# Patient Record
Sex: Female | Born: 1980 | Race: White | Hispanic: No | Marital: Married | Smoking: Former smoker
Health system: Southern US, Community
[De-identification: ages and names within clinical notes are randomized; demographics above are authoritative.]

## PROBLEM LIST (undated history)

## (undated) DIAGNOSIS — R87619 Unspecified abnormal cytological findings in specimens from cervix uteri: Secondary | ICD-10-CM

## (undated) DIAGNOSIS — O2200 Varicose veins of lower extremity in pregnancy, unspecified trimester: Secondary | ICD-10-CM

## (undated) DIAGNOSIS — IMO0002 Reserved for concepts with insufficient information to code with codable children: Secondary | ICD-10-CM

## (undated) DIAGNOSIS — R87629 Unspecified abnormal cytological findings in specimens from vagina: Secondary | ICD-10-CM

## (undated) HISTORY — DX: Reserved for concepts with insufficient information to code with codable children: IMO0002

## (undated) HISTORY — DX: Unspecified abnormal cytological findings in specimens from vagina: R87.629

## (undated) HISTORY — DX: Unspecified abnormal cytological findings in specimens from cervix uteri: R87.619

## (undated) HISTORY — DX: Varicose veins of lower extremity in pregnancy, unspecified trimester: O22.00

## (undated) HISTORY — PX: TONSILLECTOMY: SUR1361

---

## 2008-02-07 ENCOUNTER — Ambulatory Visit: Payer: Self-pay | Admitting: Obstetrics & Gynecology

## 2008-02-13 ENCOUNTER — Ambulatory Visit: Payer: Self-pay | Admitting: Obstetrics and Gynecology

## 2008-02-17 ENCOUNTER — Encounter: Admission: RE | Admit: 2008-02-17 | Discharge: 2008-02-17 | Payer: Self-pay | Admitting: Obstetrics & Gynecology

## 2008-02-22 ENCOUNTER — Ambulatory Visit: Payer: Self-pay | Admitting: Obstetrics & Gynecology

## 2008-02-27 ENCOUNTER — Ambulatory Visit: Payer: Self-pay | Admitting: Family

## 2008-02-27 ENCOUNTER — Encounter: Payer: Self-pay | Admitting: Family

## 2008-03-30 ENCOUNTER — Ambulatory Visit: Payer: Self-pay | Admitting: Physician Assistant

## 2008-04-02 ENCOUNTER — Ambulatory Visit: Payer: Self-pay | Admitting: Obstetrics & Gynecology

## 2008-04-20 ENCOUNTER — Ambulatory Visit: Payer: Self-pay | Admitting: Physician Assistant

## 2008-05-11 ENCOUNTER — Ambulatory Visit (HOSPITAL_COMMUNITY): Admission: RE | Admit: 2008-05-11 | Discharge: 2008-05-11 | Payer: Self-pay | Admitting: Obstetrics & Gynecology

## 2008-05-18 ENCOUNTER — Ambulatory Visit: Payer: Self-pay | Admitting: Family

## 2008-06-22 ENCOUNTER — Ambulatory Visit: Payer: Self-pay | Admitting: Obstetrics and Gynecology

## 2008-06-22 LAB — CONVERTED CEMR LAB
HCT: 37.6 % (ref 36.0–46.0)
MCHC: 33.2 g/dL (ref 30.0–36.0)
MCV: 90.4 fL (ref 78.0–100.0)
Platelets: 221 10*3/uL (ref 150–400)
RDW: 13.3 % (ref 11.5–15.5)
WBC: 8.7 10*3/uL (ref 4.0–10.5)

## 2008-07-20 ENCOUNTER — Ambulatory Visit: Payer: Self-pay | Admitting: Family

## 2008-07-20 LAB — CONVERTED CEMR LAB
ALT: 14 units/L (ref 0–35)
AST: 15 units/L (ref 0–37)
Albumin: 3.2 g/dL — ABNORMAL LOW (ref 3.5–5.2)
Alkaline Phosphatase: 55 units/L (ref 39–117)
Antibody Screen: NEGATIVE
Calcium: 8.2 mg/dL — ABNORMAL LOW (ref 8.4–10.5)
Chloride: 106 meq/L (ref 96–112)
MCHC: 32.9 g/dL (ref 30.0–36.0)
Platelets: 218 10*3/uL (ref 150–400)
Potassium: 3.9 meq/L (ref 3.5–5.3)
RDW: 13.4 % (ref 11.5–15.5)
Sodium: 140 meq/L (ref 135–145)

## 2008-07-23 ENCOUNTER — Ambulatory Visit: Payer: Self-pay | Admitting: Obstetrics & Gynecology

## 2008-07-23 ENCOUNTER — Encounter: Payer: Self-pay | Admitting: Family

## 2008-07-23 LAB — CONVERTED CEMR LAB
Creatinine 24 HR UR: 1442 mg/24hr (ref 700–1800)
Creatinine Clearance: 193 mL/min — ABNORMAL HIGH (ref 75–115)
Creatinine, Urine: 62.7 mg/dL

## 2008-08-03 ENCOUNTER — Ambulatory Visit: Payer: Self-pay | Admitting: Family

## 2008-08-09 ENCOUNTER — Ambulatory Visit: Payer: Self-pay | Admitting: Obstetrics & Gynecology

## 2008-08-17 ENCOUNTER — Ambulatory Visit: Payer: Self-pay | Admitting: Family

## 2008-08-29 ENCOUNTER — Ambulatory Visit: Payer: Self-pay | Admitting: Obstetrics & Gynecology

## 2008-09-12 ENCOUNTER — Ambulatory Visit: Payer: Self-pay | Admitting: Obstetrics & Gynecology

## 2008-09-12 LAB — CONVERTED CEMR LAB
Chlamydia, DNA Probe: NEGATIVE
GC Probe Amp, Genital: NEGATIVE

## 2008-09-19 ENCOUNTER — Ambulatory Visit: Payer: Self-pay | Admitting: Obstetrics & Gynecology

## 2008-09-26 ENCOUNTER — Ambulatory Visit: Payer: Self-pay | Admitting: Obstetrics & Gynecology

## 2008-09-27 ENCOUNTER — Ambulatory Visit (HOSPITAL_COMMUNITY): Admission: RE | Admit: 2008-09-27 | Discharge: 2008-09-27 | Payer: Self-pay | Admitting: Obstetrics & Gynecology

## 2008-10-01 ENCOUNTER — Inpatient Hospital Stay (HOSPITAL_COMMUNITY): Admission: AD | Admit: 2008-10-01 | Discharge: 2008-10-01 | Payer: Self-pay | Admitting: Family Medicine

## 2008-10-01 ENCOUNTER — Ambulatory Visit: Payer: Self-pay | Admitting: Family

## 2008-10-04 ENCOUNTER — Ambulatory Visit: Payer: Self-pay | Admitting: Family

## 2008-10-04 ENCOUNTER — Inpatient Hospital Stay (HOSPITAL_COMMUNITY): Admission: AD | Admit: 2008-10-04 | Discharge: 2008-10-05 | Payer: Self-pay | Admitting: Obstetrics and Gynecology

## 2008-11-16 ENCOUNTER — Ambulatory Visit: Payer: Self-pay | Admitting: Family

## 2009-02-12 ENCOUNTER — Ambulatory Visit: Payer: Self-pay | Admitting: Obstetrics & Gynecology

## 2009-05-08 IMAGING — US US OB FOLLOW-UP
1 series · 14 of 16 positions shown · non-contrast
Comparison: none

OBSTETRICAL ULTRASOUND:
 This ultrasound exam was performed in the [HOSPITAL] Ultrasound Department.  The OB US report was generated in the AS system, and faxed to the ordering physician.  This report is also available in [REDACTED] PACS.

[Series 1: us ob follow up · 14 of 16 slices shown]
[im 1/16]
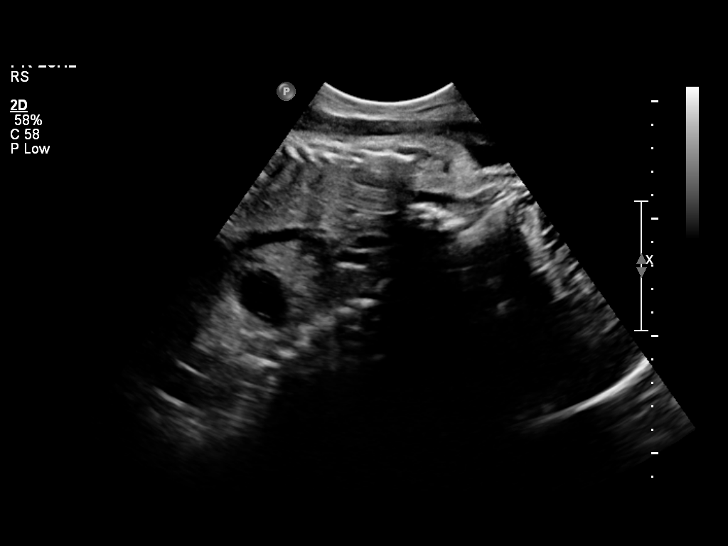
[im 2/16]
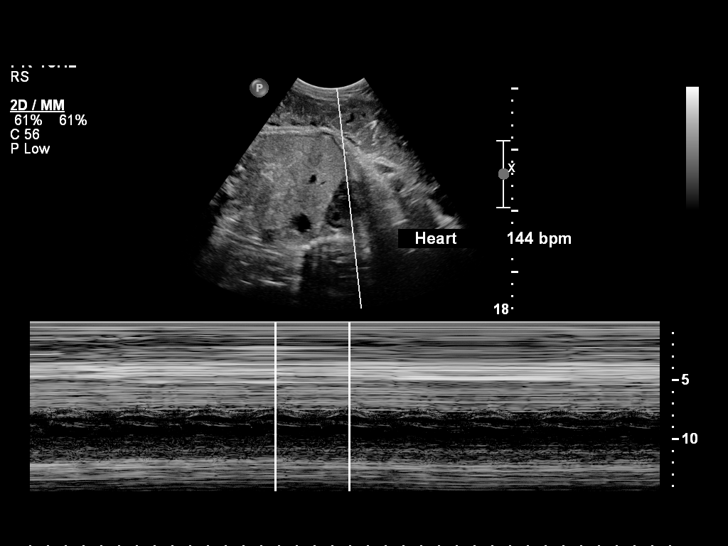
[im 3/16]
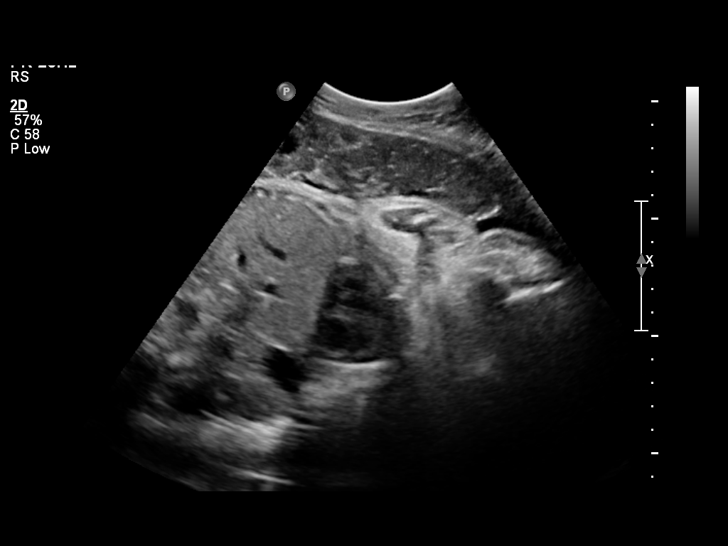
[im 5/16]
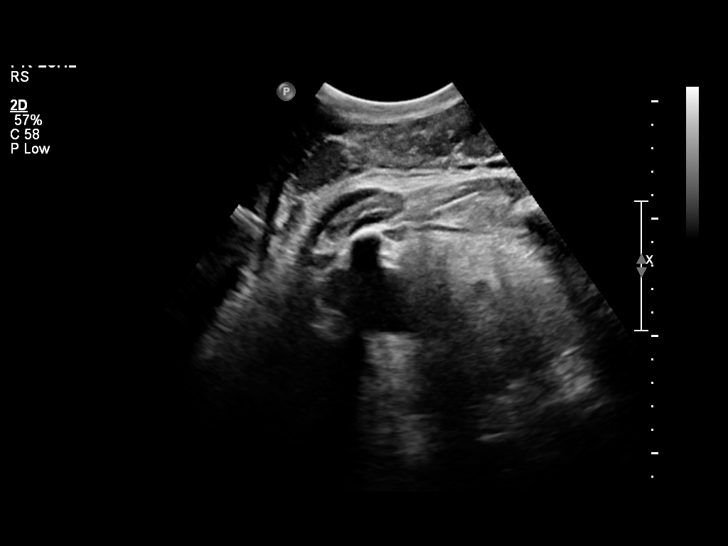
[im 6/16]
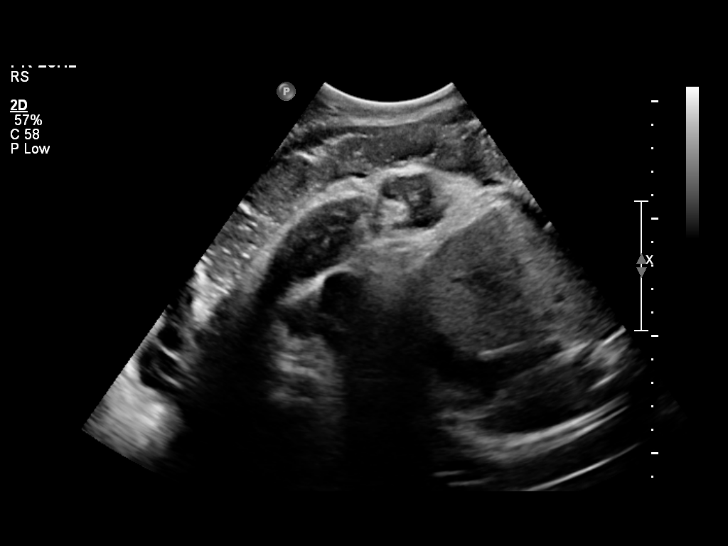
[im 7/16]
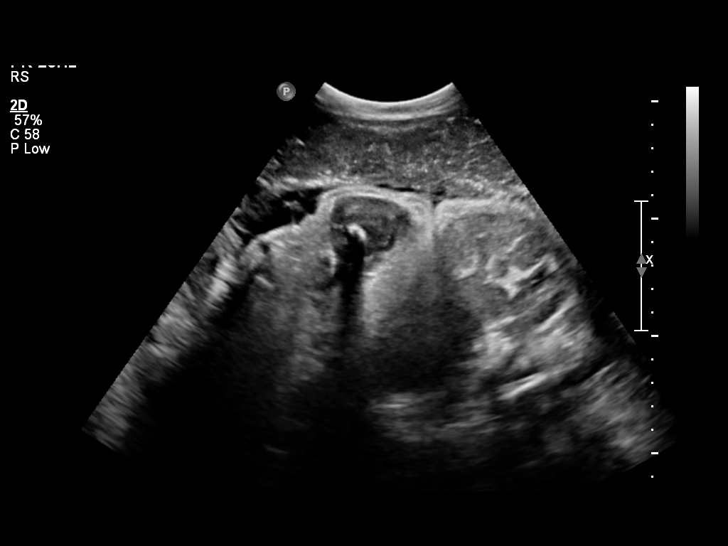
[im 8/16]
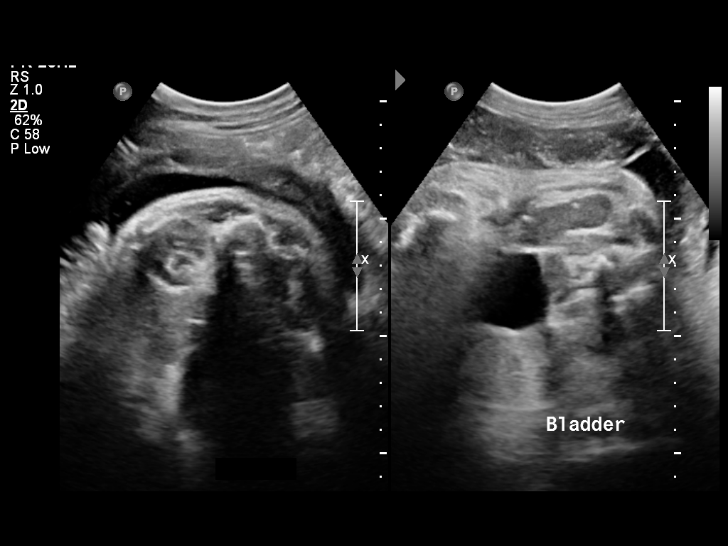
[im 9/16]
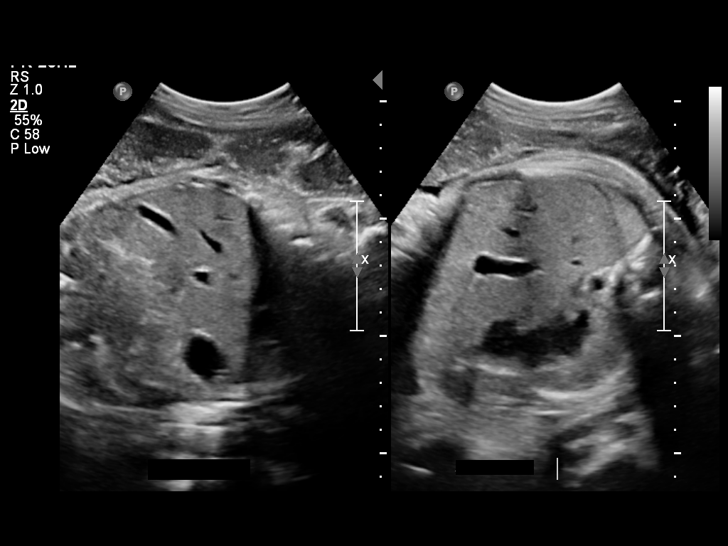
[im 10/16]
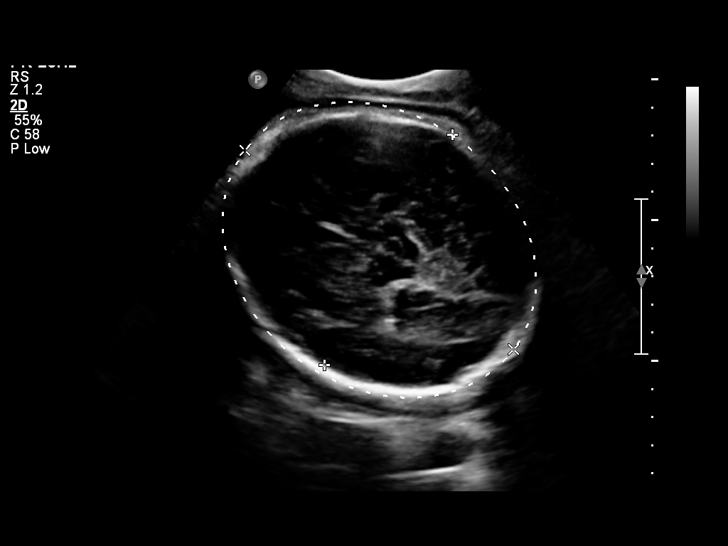
[im 11/16]
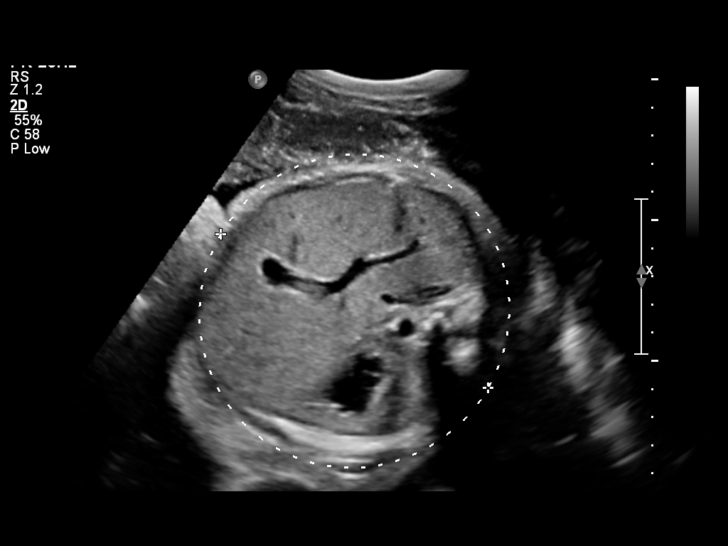
[im 13/16]
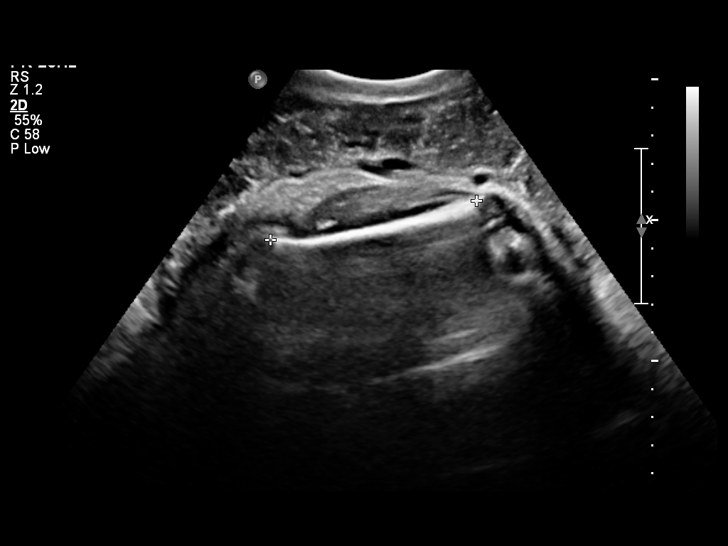
[im 14/16]
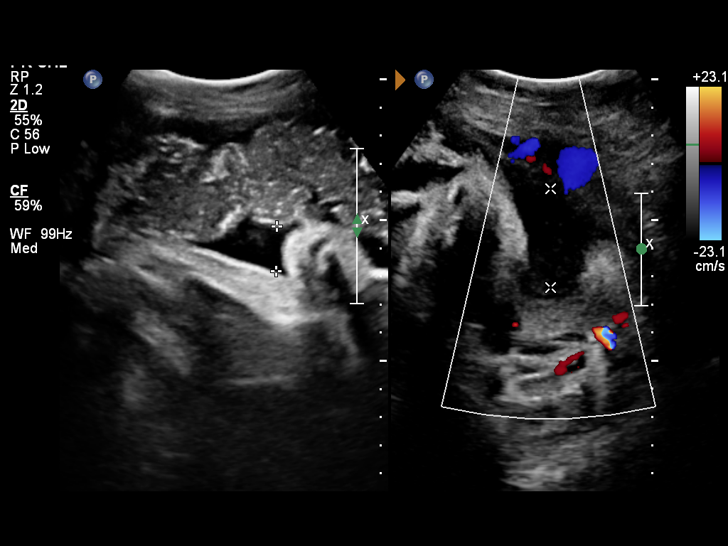
[im 15/16]
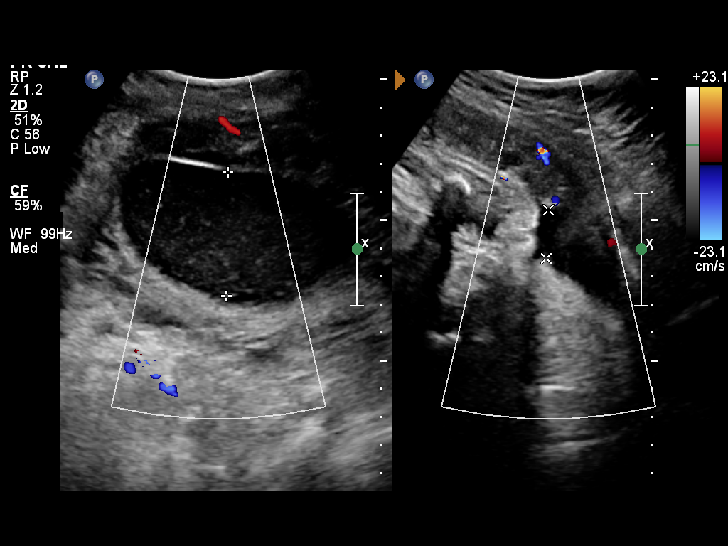
[im 16/16]
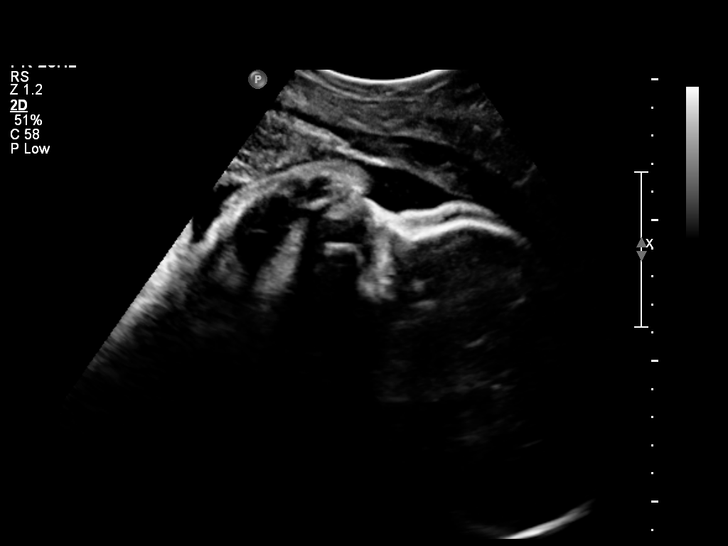

[14 of 16 positions shown; findings below may reference images not displayed]

IMPRESSION: See AS Obstetric US report.

## 2009-05-10 ENCOUNTER — Ambulatory Visit: Payer: Self-pay | Admitting: Family

## 2009-07-24 ENCOUNTER — Ambulatory Visit: Payer: Self-pay | Admitting: Family Medicine

## 2009-07-24 DIAGNOSIS — G4700 Insomnia, unspecified: Secondary | ICD-10-CM

## 2009-07-24 DIAGNOSIS — B009 Herpesviral infection, unspecified: Secondary | ICD-10-CM | POA: Insufficient documentation

## 2009-07-30 ENCOUNTER — Telehealth: Payer: Self-pay | Admitting: Family Medicine

## 2009-08-02 ENCOUNTER — Ambulatory Visit: Payer: Self-pay | Admitting: Obstetrics & Gynecology

## 2009-08-15 ENCOUNTER — Encounter: Payer: Self-pay | Admitting: Obstetrics & Gynecology

## 2009-10-24 ENCOUNTER — Ambulatory Visit: Payer: Self-pay | Admitting: Obstetrics & Gynecology

## 2010-01-03 ENCOUNTER — Ambulatory Visit: Payer: Self-pay | Admitting: Obstetrics & Gynecology

## 2010-05-01 ENCOUNTER — Encounter: Payer: Self-pay | Admitting: Family Medicine

## 2010-05-01 LAB — CONVERTED CEMR LAB: Pap Smear: ABNORMAL

## 2010-05-16 ENCOUNTER — Ambulatory Visit: Payer: Self-pay | Admitting: Family

## 2010-06-11 ENCOUNTER — Ambulatory Visit
Admission: RE | Admit: 2010-06-11 | Discharge: 2010-06-11 | Payer: Self-pay | Source: Home / Self Care | Attending: Obstetrics & Gynecology | Admitting: Obstetrics & Gynecology

## 2010-06-22 ENCOUNTER — Encounter: Payer: Self-pay | Admitting: Obstetrics & Gynecology

## 2010-07-01 NOTE — Assessment & Plan Note (Signed)
Summary: NOV:CPE   Vital Signs:  Patient profile:   30 year old female Height:      67 inches Weight:      146 pounds BMI:     22.95 Pulse rate:   82 / minute BP sitting:   119 / 75  (left arm) Cuff size:   regular  Vitals Entered By: Kathlene November (July 24, 2009 9:34 AM) CC: NP- get established Is Patient Diabetic? No  Vision Screening:Left eye with correction: 20 / 20 Right eye with correction: 20 / 25 Both eyes with correction: 20 / 15  Color vision testing: normal      Vision Entered By: Kathlene November (July 24, 2009 10:10 AM)   Primary Care Provider:  Nani Gasser MD  CC:  NP- get established.  History of Present Illness: Having some insomnia issues after the birth of her child. He is now 9 months and just starting to sleep through the night. No caffeine in the afternoon. Tries to wind down in the evening. The ambien really helps. Just started it recently.    But doesn't want to become dependent.    Feels has had some vision changes since having her son. Also recent hair loss and frequent flushing.  She is still breast feeding.  More frequent HA.  No anemia with her pregnancy. No hx of thyroid problems.   Habits & Providers  Alcohol-Tobacco-Diet     Alcohol drinks/day: <1     Tobacco Status: quit     Cigarette Packs/Day: 0.5     Year Quit: 2006  Exercise-Depression-Behavior     Does Patient Exercise: no     STD Risk: never     Drug Use: no     Seat Belt Use: always  Current Medications (verified): 1)  Ambien 10 Mg Tabs (Zolpidem Tartrate) .... Take One Tablet By Mouth At Bedtime As Needed 2)  Zovirax 400 Mg Tabs (Acyclovir) .... Take One Tablet By Mouth Tiwc A Day 3)  Prenatal Vitamins 0.8 Mg Tabs (Prenatal Multivit-Min-Fe-Fa) .... Take One Tablet By Mouth Twice A Day 4)  Ibuprofen 200 Mg Tabs (Ibuprofen) .... Take One Tablet By Mouth Twice A Day 5)  Depo-Provera 150 Mg/ml Susp (Medroxyprogesterone Acetate) .... Inject Every 3 Months  Allergies  (verified): No Known Drug Allergies  Comments:  Nurse/Medical Assistant: The patient's medications and allergies were reviewed with the patient and were updated in the Medication and Allergy Lists. 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  Married to [employer] with 2 kids.   Former Smoker Alcohol use-yes, 2-3 per week.  Drug use-no Regular exercise-no 20 oz soda a day.  Smoking Status:  quit Packs/Day:  0.5 Does Patient Exercise:  no STD Risk:  never Drug Use:  no Seat Belt Use:  always  Review of Systems       No fever/sweats/weakness, unexplained weight loss/gain.  + vison changes.  No difficulty hearing/ringing in ears, hay fever/allergies.  No chest pain/discomfort, palpitations.  No Br lump/nipple discharge.  No cough/wheeze.  No blood in BM, nausea/vomiting/diarrhea.  No nighttime urination, leaking urine, unusual vaginal bleeding, discharge (penis or vagina).  No muscle/joint pain. No rash, change in mole.  + HA, no memory loss.  No anxiety, + sleep d/o,  no depression.  No easy bruising/bleeding, unexplained lump   Physical Exam  General:  Well-developed,well-nourished,in no acute distress; alert,appropriate and cooperative throughout examination Head:  Normocephalic and atraumatic without obvious abnormalities. No apparent alopecia or balding. Eyes:  No corneal or conjunctival inflammation noted. EOMI. Perrla.  Ears:  External ear exam shows no significant lesions or deformities.  Otoscopic examination reveals clear canals, tympanic membranes are intact bilaterally without bulging, retraction, inflammation or discharge. Hearing is grossly normal bilaterally. Nose:  External nasal examination shows no deformity or inflammation. Nasal  mucosa are pink and moist without lesions or exudates. Mouth:  Oral mucosa and oropharynx without lesions or exudates.  Teeth in good repair. Neck:  No deformities, masses, or tenderness noted. Chest Wall:  No deformities, masses, or tenderness noted. Lungs:  Normal respiratory effort, chest expands symmetrically. Lungs are clear to auscultation, no crackles or wheezes. Heart:  Normal rate and regular rhythm. S1 and S2 normal without gallop, murmur, click, rub or other extra sounds. Abdomen:  Bowel sounds positive,abdomen soft and non-tender without masses, organomegaly or hernias noted. Msk:  No deformity or scoliosis noted of thoracic or lumbar spine.   Pulses:  R and L carotid,radial,dorsalis pedis and posterior tibial pulses are full and equal bilaterally Extremities:  No clubbing, cyanosis, edema, or deformity noted with normal full range of motion of all joints.   Neurologic:  No cranial nerve deficits noted. Station and gait are normal. Plantar reflexes are down-going bilaterally. DTRs are symmetrical throughout. Sensory, motor and coordinative functions appear intact. Skin:  no rashes.   Cervical Nodes:  No lymphadenopathy noted Psych:  Cognition and judgment appear intact. Alert and cooperative with normal attention span and concentration. No apparent delusions, illusions, hallucinations   Impression & Recommendations:  Problem # 1:  HEALTH MAINTENANCE EXAM (ICD-V70.0) Encouage daily calcium. Will check labs and rule out thyroid d/o.   Encourage daily exercise.  Due for screening labs.   Orders: T-Comprehensive Metabolic Panel [id number]) T-Lipid Profile [phone]) T-TSH [id number])  Problem # 2:  INSOMNIA (ICD-780.52) Lets try to switch to med that doesn't cause dependency.  Trazodone is safe with Br feeding so will try this first. Fu in 1-2 months. Call if any concerns.  Her updated medication list for this problem includes:    Ambien 10 Mg Tabs (Zolpidem tartrate)  .Marland Kitchen... Take one tablet by mouth at bedtime as needed  Complete Medication List: 1)  Ambien 10 Mg Tabs (Zolpidem tartrate) .... Take one tablet by mouth at bedtime as needed 2)  Zovirax 400 Mg Tabs (Acyclovir) .... Take one tablet by mouth tiwc a day 3)  Prenatal Vitamins 0.8 Mg Tabs (Prenatal multivit-min-fe-fa) .... Take one tablet by mouth twice a day 4)  Ibuprofen 200 Mg Tabs (Ibuprofen) .... Take one tablet by mouth twice a day 5)  Depo-provera 150 Mg/ml Susp (Medroxyprogesterone acetate) .... Inject every 3 months 6)  Trazodone Hcl 50 Mg Tabs (Trazodone hcl) .... 1/2 to 1 tabs about  1 hour before bedtime.  Patient Instructions: 1)  Encourage 1000mg  of calcium daily or 4 servings of dairy a day. 2)  [facility] [phone] Prescriptions: TRAZODONE HCL 50 MG TABS (TRAZODONE HCL) 1/2 to 1 tabs about  1 hour before bedtime.  #30 x 1   Entered and Authorized by:   Nani Gasser MD   Signed by:   Nani Gasser MD on 07/24/2009   Method used:   Electronically to        [facility]  [address] [phone]* (retail)       [address]       [place], [place]  [address]       Ph: [phone] or [phone]       Fax: [phone]   RxID:   [id number]   PAP Result Date:  01/30/2009 PAP Result:  normal

## 2010-07-01 NOTE — Progress Notes (Signed)
Summary: Lab Results  Phone Note Call from Patient Call back at Home Phone [phone]   Caller: Patient Reason for Call: Lab or Test Results Summary of Call: Patient called and left voice message requesting lab results form last office visit  Initial call taken by: Glendell Docker CMA,  July 30, 2009 1:04 PM  Follow-up for Phone Call        Please call lab. This should have crossed over but says still pending. Called and let pt know what is going on and that hopefully we will have the results in the next 1-2 days.  Follow-up by: Nani Gasser MD,  July 30, 2009 1:11 PM  Additional Follow-up for Phone Call Additional follow up Details #1::        Spoke with Gabriel Rung at [facility], she states labs are resulted out and she will fax a copy to office Additional Follow-up by: Glendell Docker CMA,  July 30, 2009 1:44 PM     Appended Document: Lab Results Call ptL Liver, kidneys, cholesterol, and thyroid look good.  March  2, 20117:28 AM Linford Arnold MD, Santina Evans  07/31/2009 @ 8:41am-Pt notified of reuslts. [id number]  Lipid Panel Test Date: 07/24/2009                        Value        Units        H/L   Reference  Cholesterol:          197          mg/dL              (914-782) LDL Cholesterol:      102          mg/dL              (95-621) HDL Cholesterol:      83           mg/dL         H    (30-86) Triglyceride:         59           mg/dL              (57-846)

## 2010-07-25 ENCOUNTER — Encounter (INDEPENDENT_AMBULATORY_CARE_PROVIDER_SITE_OTHER): Payer: BC Managed Care – PPO | Admitting: Family Medicine

## 2010-07-25 ENCOUNTER — Telehealth: Payer: Self-pay | Admitting: Family Medicine

## 2010-07-25 ENCOUNTER — Encounter: Payer: Self-pay | Admitting: Family Medicine

## 2010-07-25 DIAGNOSIS — Z0000 Encounter for general adult medical examination without abnormal findings: Secondary | ICD-10-CM

## 2010-07-25 LAB — CONVERTED CEMR LAB
HCT: 43 % (ref 36.0–46.0)
Hemoglobin: 14.5 g/dL (ref 12.0–15.0)
MCV: 88.1 fL (ref 78.0–100.0)
Platelets: 194 10*3/uL (ref 150–400)
RDW: 13.2 % (ref 11.5–15.5)
WBC: 5.5 10*3/uL (ref 4.0–10.5)

## 2010-07-27 LAB — CONVERTED CEMR LAB
BUN: 13 mg/dL (ref 6–23)
CO2: 23 meq/L (ref 19–32)
Calcium: 9.3 mg/dL (ref 8.4–10.5)
Chloride: 107 meq/L (ref 96–112)
Cholesterol: 160 mg/dL (ref 0–200)
Creatinine, Ser: 0.82 mg/dL (ref 0.40–1.20)
HDL: 74 mg/dL (ref >39)
Total Bilirubin: 0.6 mg/dL (ref 0.3–1.2)
Total CHOL/HDL Ratio: 2.2
VLDL: 7 mg/dL (ref 0–40)

## 2010-07-29 NOTE — Assessment & Plan Note (Signed)
Summary: CPE   Vital Signs:  Patient profile:   30 year old female Height:      67 inches Weight:      138 pounds BMI:     21.69 Pulse rate:   76 / minute BP sitting:   95 / 64  (right arm) Cuff size:   regular  Vitals Entered By: Avon Gully CMA, Duncan Dull) (July 25, 2010 9:38 AM) CC: CPE   Primary Care Provider:  Nani Gasser MD  CC:  CPE.  History of Present Illness: Got up to 158 and started weight watchers now.  She plans on losing 2 more lbs. Plans on getting pregnant again.   Current Medications (verified): 1)  Ambien 10 Mg Tabs (Zolpidem Tartrate) .... Take One Tablet By Mouth At Bedtime As Needed 2)  Zovirax 400 Mg Tabs (Acyclovir) .... Take One Tablet By Mouth Tiwc A Day 3)  Prenatal Vitamins 0.8 Mg Tabs (Prenatal Multivit-Min-Fe-Fa) .... Take One Tablet By Mouth Twice A Day 4)  Ibuprofen 200 Mg Tabs (Ibuprofen) .... Take One Tablet By Mouth Twice A Day 5)  First-Testosterone 2 % Oint (Testosterone Propionate)  Allergies (verified): No Known Drug Allergies  Comments:  Nurse/Medical Assistant: The patient's medications and allergies were reviewed with the patient and were updated in the Medication and Allergy Lists. Avon Gully CMA, Duncan Dull) (July 25, 2010 9:41 AM)  Past History:  Past Medical History: Gyn- [facility].  Hx of recurrent colposcopy.   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  Married to [employer] with 2 kids.   Former Smoker Alcohol use-yes, 2-3 per week.  Drug use-no Regular exercise-no 20 oz soda a day.    Review of Systems  The patient denies anorexia, fever, weight loss, weight gain, vision loss, decreased hearing, hoarseness, chest pain, syncope, dyspnea on exertion, peripheral edema, prolonged cough, headaches, hemoptysis, abdominal pain,  melena, hematochezia, hematuria, incontinence, genital sores, muscle weakness, suspicious skin lesions, transient blindness, difficulty walking, depression, unusual weight change, abnormal bleeding, enlarged lymph nodes, angioedema, breast masses, and testicular masses.    Physical Exam  General:  Well-developed,well-nourished,in no acute distress; alert,appropriate and cooperative throughout examination Head:  Normocephalic and atraumatic without obvious abnormalities. No apparent alopecia or balding. Eyes:  No corneal or conjunctival inflammation noted. EOMI. Perrla. Ears:  External ear exam shows no significant lesions or deformities.  Otoscopic examination reveals clear canals, tympanic membranes are intact bilaterally without bulging, retraction, inflammation or discharge. Hearing is grossly normal bilaterally. Nose:  External nasal examination shows no deformity or inflammation.  Mouth:  Oral mucosa and oropharynx without lesions or exudates.  Teeth in good repair. Neck:  No deformities, masses, or tenderness noted. Chest Wall:  No deformities, masses, or tenderness noted. Lungs:  Normal respiratory effort, chest expands symmetrically. Lungs are clear to auscultation, no crackles or wheezes. Heart:  Normal rate and regular rhythm. S1 and S2 normal without gallop, murmur, click, rub or other extra sounds. Abdomen:  Bowel sounds positive,abdomen soft and non-tender without masses, organomegaly or hernias noted. Msk:  No deformity or scoliosis noted of thoracic or lumbar spine.   Pulses:  Radial 2+ , DP pulses 2+  Extremities:  No clubbing, cyanosis, edema, or deformity noted with normal full range of motion of all joints.   Neurologic:  No cranial nerve deficits noted. Station and gait are normal.DTRs  are symmetrical throughout. Sensory, motor and coordinative functions appear intact. Skin:  no rashes.   Cervical Nodes:  No lymphadenopathy noted Psych:  Cognition and judgment appear intact.  Alert and cooperative with normal attention span and concentration. No apparent delusions, illusions, hallucinations   Impression & Recommendations:  Problem # 1:  HEALTH MAINTENANCE EXAM (ICD-V70.0)  Exam is normal.  Make sure getting adequate calcium in your diet.  PNV daily since planning on getting pregnant.  Due for screeening labs.  Orders: T-Comprehensive Metabolic Panel [id number]) T-Lipid Profile [id number]) T-CBC No Diff ([id number])  Complete Medication List: 1)  Ambien 10 Mg Tabs (Zolpidem tartrate) .... Take one tablet by mouth at bedtime as needed 2)  Zovirax 400 Mg Tabs (Acyclovir) .... Take one tablet by mouth tiwc a day 3)  Prenatal Vitamins 0.8 Mg Tabs (Prenatal multivit-min-fe-fa) .... Take one tablet by mouth twice a day 4)  Ibuprofen 200 Mg Tabs (Ibuprofen) .... Take one tablet by mouth twice a day 5)  First-testosterone 2 % Oint (Testosterone propionate)  Contraindications/Deferment of Procedures/Staging:    Test/Procedure: PSA    Reason for deferment: patient declined   Patient Instructions: 1)  Take calcium +vitamin D daily.  2)  You need to lose weight. Consider a lower calorie diet and regular exercise.  3)  We will call you with your lab results.    Orders Added: 1)  Est. Patient age 44-39 [45] 2)  T-Comprehensive Metabolic Panel [[id number]] 3)  T-Lipid Profile [phone] 4)  T-CBC No Diff [[id number]]   Immunization History:  Tetanus/Td Immunization History:    Tetanus/Td:  historical (06/02/2003)   Immunization History:  Tetanus/Td Immunization History:    Tetanus/Td:  Historical (06/02/2003)   Preventive Care Screening  Pap Smear:    Date:  05/01/2010    Next Due:  10/2010    Results:  abnormal   Last Tetanus Booster:    Date:  06/02/2003    Results:  Historical       Immunization History:  Tetanus/Td Immunization History:    Tetanus/Td:  historical (06/02/2003)

## 2010-08-07 NOTE — Progress Notes (Signed)
Summary: patient Question  Phone Note Call from Patient   Caller: Patient Summary of Call: Patient was seen this morning and while she was here, she forgot to ask Dr. Linford Arnold a question. Will you please call her back at [phone]... Thanks.Michaelle Copas  July 25, 2010 12:11 PM  Initial call taken by: Michaelle Copas,  July 25, 2010 12:11 PM  Follow-up for Phone Call        Pleas call pt and see what she might need.  Follow-up by: Nani Gasser MD,  July 25, 2010 12:22 PM  Additional Follow-up for Phone Call Additional follow up Details #1::        patient state it was noted on her check out sheet that she needed to lose wieght and monitor her calorie intake. She states she has lost wieght  and wanted to make sure there was not anything additional that she needed to do concerning her wieght. Additional Follow-up by: Glendell Docker CMA,  July 28, 2010 11:48 AM    Additional Follow-up for Phone Call Additional follow up Details #2::    TEll her. No she doesn't need to lose more weight, I am sorry I clicked the wrong button.  Follow-up by: Nani Gasser MD,  July 28, 2010 11:52 AM  Additional Follow-up for Phone Call Additional follow up Details #3:: Details for Additional Follow-up Action Taken: call returned to patient she was informed per Dr Linford Arnold instructions Additional Follow-up by: Glendell Docker CMA,  July 28, 2010 3:27 PM

## 2010-08-18 ENCOUNTER — Ambulatory Visit: Payer: BC Managed Care – PPO

## 2010-08-18 ENCOUNTER — Encounter: Payer: Self-pay | Admitting: Physician Assistant

## 2010-08-18 ENCOUNTER — Other Ambulatory Visit: Payer: Self-pay | Admitting: Obstetrics & Gynecology

## 2010-08-18 DIAGNOSIS — N926 Irregular menstruation, unspecified: Secondary | ICD-10-CM

## 2010-08-18 DIAGNOSIS — N939 Abnormal uterine and vaginal bleeding, unspecified: Secondary | ICD-10-CM

## 2010-08-18 LAB — CONVERTED CEMR LAB
Chlamydia, DNA Probe: NEGATIVE
GC Probe Amp, Genital: NEGATIVE
TSH: 1.547 microintl units/mL (ref 0.350–4.500)

## 2010-08-19 ENCOUNTER — Ambulatory Visit
Admission: RE | Admit: 2010-08-19 | Discharge: 2010-08-19 | Disposition: A | Discharge status: Home / Self Care | Payer: BC Managed Care – PPO | Source: Ambulatory Visit | Attending: Obstetrics & Gynecology | Admitting: Obstetrics & Gynecology

## 2010-08-19 ENCOUNTER — Encounter: Payer: Self-pay | Admitting: Physician Assistant

## 2010-08-19 ENCOUNTER — Other Ambulatory Visit: Payer: Self-pay | Admitting: Obstetrics & Gynecology

## 2010-08-19 DIAGNOSIS — N926 Irregular menstruation, unspecified: Secondary | ICD-10-CM

## 2010-08-22 ENCOUNTER — Ambulatory Visit: Payer: BC Managed Care – PPO

## 2010-08-22 DIAGNOSIS — N949 Unspecified condition associated with female genital organs and menstrual cycle: Secondary | ICD-10-CM

## 2010-08-22 NOTE — Assessment & Plan Note (Signed)
NAME:  Janice Aguilar, Janice Aguilar NO.:  [id number]  MEDICAL RECORD NO.:  [id number]           PATIENT TYPE:  LOCATION:  [facility]           FACILITY:  PHYSICIAN:  Maylon Cos, CNM    DATE OF BIRTH:  June 20, 1980  DATE OF SERVICE:  08/18/2010                                 CLINIC NOTE  REASON FOR TODAY'S VISIT:  Irregular menses.  HISTORY OF PRESENT ILLNESS:  The patient is a 30 year old who presents with complaints of irregular menses.  She stopped her oral contraceptive pills on June 12, 2010, at which time she proceeded to have a 5-day period.  In February, she states the bleeding came on approximately 28 days later and she bled for 12 days at that time in February.  Since then she has had routine 1 day per week of bleeding that is heavy enough that she needs to wear a tampon.  She describes it more than spotting and then the next day it is gone.  She is having regular bowel movements; however, she is feeling bloated and some abdominal fullness.  RECENT HISTORY:  The patient has lost 20 pounds on Weight Watchers since she was here in January.  She has also been in our office recently in December or January for an abnormal Pap smear and for a colpo.  She was also treated for low libido.  She is using testosterone cream sparingly, but she has not used any since February 2012.  PHYSICAL EXAMINATION:  GENERAL:  [name] is a pleasant 30 year old Caucasian female in no apparent distress. HEENT:  Grossly normal. ABDOMEN:  Soft and nontender. GENITALIA:  She is Tanner V with a partially-shaved perineum.  No lesions or masses noted on the external genitalia.  Internal genitalia are pink without lesion.  Good tone and irregular rugae.  Speculum exam reveals parous cervix with no lesions.  There is moderate amount of white creamy discharge noted and sent for examination with wet prep. Gonorrhea and Chlamydia cultures were also obtained given her  bleeding. Bimanual exam reveals a not enlarged, but tender uterus.  Adnexa are not enlarged and nontender.  ASSESSMENT:  Abnormal uterine bleeding.  PLAN: 1. Check a TSH today and also did a release of information to Dr.     Shelah Lewandowsky office for labs that were drawn in January at annual     exam. 2. Obtain a pelvic ultrasound. 3. The patient should follow up in approximately 2 weeks for results     of the TSH and pelvic ultrasound and plan.  The patient does desire pregnancy and does not desire to get back on oral contraceptive pills.  At this time, she is desiring a pregnancy to try to conceive in May.          ______________________________ Maylon Cos, CNM    SS/MEDQ  D:  08/18/2010  T:  08/19/2010  Job:  [id number]

## 2010-09-04 NOTE — Assessment & Plan Note (Signed)
NAME:  Janice Aguilar, Janice Aguilar NO.:  [id number]  MEDICAL RECORD NO.:  [id number]           PATIENT TYPE:  LOCATION:  [facility]           FACILITY:  PHYSICIAN:  Sid Falcon, CNM  DATE OF BIRTH:  1980-12-04  DATE OF SERVICE:  08/22/2010                                 CLINIC NOTE  REASON FOR VISIT:  Review of results.  The patient was previously seen on August 18, 2010, for history of irregular menses.  The patient was here, she discontinued birth control pills in early January in which she proceeded to have a 5-day cycle. She is concerned because her cycle returned in February and she bled for 12 days.  On her visit on August 18, 2010, labs were ordered; TSH, STD screening, and a pelvic ultrasound.  Results;  Chlamydia and gonorrhea both negative.  Wet prep, few clue, but due to patient not having any symptoms, I did not treat.  Pelvic ultrasound normal.  The patient was advised that since her last Depo was in August 2011 that she may experience some irregular bleeding until her hormones are regulated. The patient is seeking pregnancy and will continue monitoring her ovulation cycle for a successful pregnancy, follow up as needed.     Sid Falcon, CNM    WM/MEDQ  D:  08/22/2010  T:  08/23/2010  Job:  [id number]

## 2010-09-09 LAB — DIFFERENTIAL
Basophils Absolute: 0 10*3/uL (ref 0.0–0.1)
Basophils Relative: 0 % (ref 0–1)
Eosinophils Relative: 1 % (ref 0–5)
Monocytes Absolute: 0.7 10*3/uL (ref 0.1–1.0)
Neutro Abs: 6.4 10*3/uL (ref 1.7–7.7)

## 2010-09-09 LAB — URINALYSIS, ROUTINE W REFLEX MICROSCOPIC
Hgb urine dipstick: NEGATIVE
Nitrite: NEGATIVE
Protein, ur: NEGATIVE mg/dL
Specific Gravity, Urine: 1.01 (ref 1.005–1.030)
Urobilinogen, UA: 0.2 mg/dL (ref 0.0–1.0)

## 2010-09-09 LAB — COMPREHENSIVE METABOLIC PANEL
ALT: 13 U/L (ref 0–35)
AST: 21 U/L (ref 0–37)
Albumin: 2.7 g/dL — ABNORMAL LOW (ref 3.5–5.2)
Chloride: 106 mEq/L (ref 96–112)
Creatinine, Ser: 0.54 mg/dL (ref 0.4–1.2)
GFR calc Af Amer: >60 mL/min (ref >60)
Sodium: 136 mEq/L (ref 135–145)
Total Bilirubin: 0.3 mg/dL (ref 0.3–1.2)

## 2010-09-09 LAB — CBC
HCT: 35.9 % — ABNORMAL LOW (ref 36.0–46.0)
Hemoglobin: 12.4 g/dL (ref 12.0–15.0)
MCHC: 34.7 g/dL (ref 30.0–36.0)
MCHC: 34.9 g/dL (ref 30.0–36.0)
MCV: 87.3 fL (ref 78.0–100.0)
Platelets: 193 10*3/uL (ref 150–400)
Platelets: 199 10*3/uL (ref 150–400)
RDW: 13.3 % (ref 11.5–15.5)
RDW: 13.5 % (ref 11.5–15.5)
WBC: 10.5 10*3/uL (ref 4.0–10.5)

## 2010-10-14 NOTE — Assessment & Plan Note (Signed)
NAME:  Janice Aguilar, Janice Aguilar NO.:  [id number]   MEDICAL RECORD NO.:  [id number]           PATIENT TYPE:   LOCATION:  [facility]           FACILITY:   PHYSICIAN:  Sid Falcon, CNM       DATE OF BIRTH:   DATE OF SERVICE:                                  CLINIC NOTE   REASON FOR VISIT:  Well-woman exam.   Current birth control method, Depo-Provera injection and last Pap smear  was in September 2009.   The patient is a previous patient of the [facility] with good  vaginal delivery 7 months prior.  She is currently breast-feeding well.  She is also reporting hemorrhoids, not itching, but somewhat  uncomfortable, no bleeding from the hemorrhoids, also has an occasional  headache, does not affect daily life; however.  I was wondering if she  did take additional medication besides ibuprofen for the headaches.  The  patient also states that since the other day she does have decreased sex  drive and understands it is related to breast-feeding; however, wonders  if she could possibly use some type of cream to assist to state there is  no desire there and would like to have a relation with her husband if  possible.   PHYSICAL EXAMINATION:  GENERAL:  The patient is alert, alert and  oriented x3, blood pressure 126/79, pulse 77.  NECK:  No thyromegaly, nontender with palpation.  No masses.  BREASTS:  Soft and nontender.  No dominant masses.  No nipple bleeding  bilaterally.  No retractions and no dimpling. CARDIOVASCULAR SYSTEM:  Regular rate and rhythm without murmurs, gallops, or rubs.  LUNGS:  Clear to auscultation bilaterally.  ABDOMEN:  Soft and nontender.  No hepatosplenomegaly.  Positive bowel  sounds x4.  PELVIS:  No vaginal bleeding.  No abnormal lesions.  Cervix, no abnormal  lesions.  No bleeding.  Negative cervical motion tenderness.  Uterus  mobile, midline.  Adnexa nontender with palpation.  No dominant masses.  The patient is extremely  anxious with the vaginal exam.  I needed to  talk her through in order to complete the pelvic exam.   ASSESSMENT:  1. Hemorrhoids.  2. Decreased sex drive.  3. Well-woman exam.   PLAN:  Received Depo-Provera 150 mg today for birth control.  Prescription written for ProctoFoam to assist in the hemorrhoids and the  prescription written for estrogen cream 0.5 mg daily, which was advised  by Dr. Marice Potter to possibly assist with vaginal lubrication and discharge.  Advised the patient to schedule a date night with husband  and arrange child care to assist in making time to spend with him.  The  patient will follow up in 3 months for repeat Depo-Provera or sooner if  needed.      Sid Falcon, CNM     WM/MEDQ  D:  05/10/2009  T:  05/11/2009  Job:  [id number]

## 2010-10-14 NOTE — Assessment & Plan Note (Signed)
NAME:  Janice Aguilar, Janice Aguilar             ACCOUNT NO.:  [id number]   MEDICAL RECORD NO.:  [id number]          PATIENT TYPE:  POB   LOCATION:  [facility] [place]         FACILITY:  [facility]   PHYSICIAN:  Caren Griffins, CNM       DATE OF BIRTH:  January 06, 1981   DATE OF SERVICE:                                  CLINIC NOTE   HISTORY:  This is a 30 year old G2, P 1-0-0-1 who by LMP is 6 weeks 0  days and presents today with 2-day history of scant amount of dark red  brownish spotting and some mild lower abdominal cramping.  The cramping  is experienced in the suprapubic region and is menstrual like.  She does  have positive subjective symptoms of pregnancy including breast  soreness, fatigue, not nauseated.   PHYSICAL EXAMINATION:  VITAL SIGNS:  BP 117/79, pulse 99, weight 147.  GENERAL:  WN, WD, in NAD.  ABDOMEN:  Soft, flat, nontender.  PELVIC:  Deferred.  Vaginal probe ultrasound by Vernona Rieger reveals IUP twins  with a gestational sac seen.   ASSESSMENT:  Early intrauterine pregnancy, twin gestation , threatened  spontaneous abortion.   PLAN:  Quantitative hCG is done today and she will come back in 48 hours  for another.  She is scheduled for follow-up ultrasound in 1 week.           ______________________________  Caren Griffins, CNM     DP/MEDQ  D:  02/13/2008  T:  02/14/2008  Job:  [id number]

## 2010-10-14 NOTE — Assessment & Plan Note (Signed)
NAME:  Janice Aguilar, Janice Aguilar             ACCOUNT NO.:  [id number]   MEDICAL RECORD NO.:  [id number]          PATIENT TYPE:  POB   LOCATION:  [facility]         FACILITY:  [facility]   PHYSICIAN:  Sid Falcon, CNM  DATE OF BIRTH:  [birth year]   DATE OF SERVICE:  11/16/2008                                  CLINIC NOTE   The patient is here for a 6-week postpartum exam, a normal spontaneous  vaginal delivery on Oct 04, 2008, without complications, delivered the  female infant, weighing 8 pounds 6 ounces, Apgars 8 and 9.  EBL  approximately 400 mL and no lacerations.  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  No additional questions  or concerns.   EXAMINATION:  Alert and oriented x3.  No other exam indicated.   ASSESSMENT:  Postpartum exam.   PLAN:  Reviewed all forms of birth control method including side effects  and benefits, and provided encouragement for breast-feeding and weight  loss to date.  The patient will call health insurance and find out the  deductible for a Mirena IUD, desires this method if the payment is not  high, we will call to schedule when information is found out.      Sid Falcon, CNM     WM/MEDQ  D:  11/16/2008  T:  11/17/2008  Job:  [id number]

## 2010-10-14 NOTE — Assessment & Plan Note (Signed)
NAME:  Janice Aguilar, Janice Aguilar NO.:  [id number]   MEDICAL RECORD NO.:  [id number]          PATIENT TYPE:  POB   LOCATION:  [facility] [place]         FACILITY:  [facility]   PHYSICIAN:  Elsie Lincoln, MD      DATE OF BIRTH:  Apr 27, 1981   DATE OF SERVICE:                                  CLINIC NOTE   The patient is a 30 year old female who is postpartum 4 months who is on  Micronor.  The patient cannot remember to take the birth control pills  and would like to go back on Depo.  She has been on Depo in the past for  8 years.  She is very familiar with the side effects of this medication.  She does not have any problems with migraine headaches or depression.  She understands that there can be breakthrough bleeding for up to 6  months.  She also understands there is a slow return to fertility.  We  will initiate Depo-Provera today.  She is UPT negative today.  She is  due for her yearly exam.  She will come back in 12 weeks for that, as  well as her second Depo-Provera.  Vital signs today pulse 79, blood  pressure 125/93, weight 148, height 67 inches.           ______________________________  Elsie Lincoln, MD     KL/MEDQ  D:  02/12/2009  T:  02/13/2009  Job:  [id number]

## 2010-10-14 NOTE — Assessment & Plan Note (Signed)
NAME:  Janice Aguilar, Janice Aguilar             ACCOUNT NO.:  [id number]   MEDICAL RECORD NO.:  [id number]          PATIENT TYPE:  POB   LOCATION:  [facility] [place]         FACILITY:  [facility]   PHYSICIAN:  Sid Falcon, CNM  DATE OF BIRTH:  [birth year]   DATE OF SERVICE:  05/16/2010                                  CLINIC NOTE   REASON FOR VISIT:  Well-woman exam.   Janice Aguilar is here for her annual well-woman exam, current birth control  method is Loestrin Fe, here with concerns due to decreased sex drive,  has been since she has had the baby 19 months ago.  The patient denies  it is related to current stress and mental state and wants to know if  she can do something to improve this.  The patient also verbalizes that  she desires to have another pregnancy early next year in April and you  to discuss plan for birth control method as well.   PHYSICAL EXAMINATION:  VITAL SIGNS:  Stable, pulse 93, blood pressure  128/76, weight 151.  GENERAL:  The patient is alert and oriented x3.  No signs of acute  distress.  NECK:  No thyromegaly.  CHEST:  Cardiovascular system regular rate and rhythm without murmurs,  gallops or rubs.  LUNGS:  Clear to auscultation.  BREASTS:  Soft, nontender.  No dominant masses.  No nipple discharge.  No retractions.  ABDOMEN:  No hepatosplenomegaly.  PELVIS:  No abnormal lesions.  No abnormal discharge.  Cervix visualized  without difficulty.  Patent os.  Pap smear obtained.  No  lymphadenopathy.   ASSESSMENT:  1. Well-woman exam.  2. Decreased sex drive.  3. Family planning discussion.   PLAN:  The patient's Pap smear sent to lab.  Discussed possible remedies  for the decreased sex drive.  Consulted with Dr. Macon Large.  Advised using  a testosterone cream 2% apply.  Pea-sized amount on clitoris and labial  area 3 times a week.  The patient was instructed that when she does  desire to become pregnant that she should stop taking the testosterone  cream.  Family  planning discussion.  The patient will stop Loestrin Fe  and begin using condoms this month with the hopes of having 2-3 regular  cycles before attempting pregnancy in April 2012.  The patient is  advised to follow up once pregnancy is obtained or sooner if needed      Sid Falcon, [place]    WM/MEDQ  D:  05/16/2010  T:  05/17/2010  Job:  [phone] [id number]

## 2010-10-15 ENCOUNTER — Ambulatory Visit: Payer: BC Managed Care – PPO | Admitting: Obstetrics & Gynecology

## 2010-10-17 ENCOUNTER — Ambulatory Visit (INDEPENDENT_AMBULATORY_CARE_PROVIDER_SITE_OTHER): Payer: BC Managed Care – PPO

## 2010-10-17 DIAGNOSIS — N912 Amenorrhea, unspecified: Secondary | ICD-10-CM

## 2010-12-11 NOTE — Assessment & Plan Note (Signed)
NAME:  Janice Aguilar, Janice Aguilar NO.:  [id number]  MEDICAL RECORD NO.:  [id number]           PATIENT TYPE:  LOCATION:  [facility] [place]           FACILITY:  PHYSICIAN:  Sid Falcon, CNM  DATE OF BIRTH:  09-28-1980  DATE OF SERVICE:  10/17/2010                                 CLINIC NOTE  REASON FOR VISIT:  Amenorrhea.  The patient is here due to concerns of last menstrual period on September 04, 2010.  History of Depo-Provera use in the past year.  The patient desires to conceive at this time and wants to know if there is anything she can do that will assist her.  Reports occasional left-sided pelvic pain, however, had ultrasound completed in March with negative results. I reemphasized to the patient that use of Depo can make cycles irregular for up to a year.  Reported that her body may be trying to regulate hormonally and that it may need additional time before regular cycles return.  Due to the patient's request, we will draw an [facility] and [facility] today. Follow up as needed.     Sid Falcon, CNM    WM/MEDQ  D:  10/17/2010  T:  10/17/2010  Job:  [id number]

## 2010-12-26 ENCOUNTER — Encounter: Payer: Self-pay | Admitting: Emergency Medicine

## 2010-12-26 ENCOUNTER — Telehealth: Payer: Self-pay | Admitting: Emergency Medicine

## 2010-12-26 NOTE — Telephone Encounter (Signed)
Pt called to let me know that she had a positive UPT this morning.  Her LMP was 11/26/10.  She is currently taking PNV and will call to schedule NOB visit.

## 2010-12-26 NOTE — Telephone Encounter (Signed)
Pt called, requests return call.  She would not give details of reason for call when asked.

## 2011-01-16 ENCOUNTER — Ambulatory Visit (INDEPENDENT_AMBULATORY_CARE_PROVIDER_SITE_OTHER): Payer: BC Managed Care – PPO | Admitting: Family

## 2011-01-16 VITALS — BP 104/66 | Temp 98.0°F | Wt 131.0 lb

## 2011-01-16 DIAGNOSIS — Z3480 Encounter for supervision of other normal pregnancy, unspecified trimester: Secondary | ICD-10-CM

## 2011-01-16 DIAGNOSIS — Z1272 Encounter for screening for malignant neoplasm of vagina: Secondary | ICD-10-CM

## 2011-01-16 DIAGNOSIS — Z113 Encounter for screening for infections with a predominantly sexual mode of transmission: Secondary | ICD-10-CM

## 2011-01-16 NOTE — Progress Notes (Signed)
  Subjective:    Janice Aguilar is a [id number] [redacted]w[redacted]d being seen today for her first obstetrical visit.  Her obstetrical history is significant for group B strep colonizer. Patient does intend to breast feed. Pregnancy history fully reviewed.  Patient reports headache and nausea.  Reports no appetite and not eating like she should.    Filed Vitals:   01/16/11 0949  BP: 104/66  Temp: 98 F (36.7 C)  Weight: 131 lb (59.421 kg)    HISTORY: OB History    Grav Para Term Preterm Abortions TAB SAB Ect Mult Living   3 2 2       2      # Outc Date GA Lbr Len/2nd Wgt Sex Del Anes PTL Lv   1 TRM 10/01 [redacted]w[redacted]d  7lb10.5oz(3.473kg) F SVD EPI No Yes   2 TRM 5/10 [redacted]w[redacted]d  8lb6oz(3.799kg) M SVD None No Yes   3 CUR              Past Medical History  Diagnosis Date  . Anemia   . Abnormal Pap smear    Past Surgical History  Procedure Date  . Tonsillectomy    Family History  Problem Relation Age of Onset  . Hypertension Father   . Heart disease Mother   . Diabetes Maternal Grandmother      Exam    Uterine Size: below the pelvis  Pelvic Exam:    Perineum: No Hemorrhoids   Vulva: normal, Bartholin's, Urethra, Skene's normal   Vagina:  normal mucosa, normal discharge   pH: n/a   Cervix: no bleeding following Pap, no cervical motion tenderness and no lesions   Adnexa: normal adnexa and no mass, fullness, tenderness   Bony Pelvis: proven to 8lbs  System: Breast:  normal appearance, no masses or tenderness   Skin: normal coloration and turgor, no rashes    Neurologic: oriented, normal   Extremities: normal strength, tone, and muscle mass   HEENT neck supple with midline trachea, thyroid without masses and trachea midline   Mouth/Teeth mucous membranes moist, pharynx normal without lesions   Neck supple and no masses   Cardiovascular: regular rate and rhythm, no murmurs or gallops   Respiratory:  appears well, vitals normal, no respiratory distress, acyanotic, normal RR, neck free of  mass or lymphadenopathy, chest clear, no wheezing, crepitations, rhonchi, normal symmetric air entry, S1, S2 normal, no gallop, no murmur, chest clear, no JVD, no HSM, no edema   Abdomen: soft, non-tender; bowel sounds normal; no masses,  no organomegaly   Urinary: urethral meatus normal      Assessment:    Pregnancy: [id number] Patient Active Problem List  Diagnoses  . COLD SORE  . INSOMNIA        Plan:     Initial labs drawn. Prenatal vitamins. Problem list reviewed and updated. Genetic Screening discussed Integrated Screen and Quad Screen: declined.  Ultrasound discussed; fetal survey: requested at 18-20 wks.  Follow up in 4 weeks.   [facility] 01/16/2011

## 2011-01-17 LAB — OBSTETRIC PANEL
Hepatitis B Surface Ag: NEGATIVE
Lymphocytes Relative: 27 % (ref 12–46)
Lymphs Abs: 1.8 10*3/uL (ref 0.7–4.0)
Neutro Abs: 4.6 10*3/uL (ref 1.7–7.7)
Neutrophils Relative %: 68 % (ref 43–77)
Platelets: 235 10*3/uL (ref 150–400)
RBC: 4.37 MIL/uL (ref 3.87–5.11)
Rubella: 30.9 IU/mL — ABNORMAL HIGH
WBC: 6.8 10*3/uL (ref 4.0–10.5)

## 2011-01-18 LAB — CULTURE, URINE COMPREHENSIVE: Colony Count: NO GROWTH

## 2011-01-27 NOTE — Progress Notes (Signed)
Pt has LGSIL with positive HPV needs COLPO

## 2011-02-20 ENCOUNTER — Ambulatory Visit (INDEPENDENT_AMBULATORY_CARE_PROVIDER_SITE_OTHER): Payer: BC Managed Care – PPO | Admitting: Advanced Practice Midwife

## 2011-02-20 VITALS — BP 118/73 | Temp 98.6°F | Wt 136.0 lb

## 2011-02-20 DIAGNOSIS — Z3480 Encounter for supervision of other normal pregnancy, unspecified trimester: Secondary | ICD-10-CM

## 2011-02-20 DIAGNOSIS — R87619 Unspecified abnormal cytological findings in specimens from cervix uteri: Secondary | ICD-10-CM

## 2011-02-20 NOTE — Progress Notes (Signed)
p-94 

## 2011-02-20 NOTE — Progress Notes (Signed)
Unable to her FHTs by doppler. 158 by [place]. Discussed LGSIL Pap. Scheduled Colpo. Declines genetic screening.

## 2011-02-20 NOTE — Patient Instructions (Signed)
Pregnancy - First Trimester During sexual intercourse, millions of sperm go into the vagina. Only 1 sperm will penetrate and fertilize the female egg while it is in the Fallopian tube. One week later, the fertilized egg implants into the wall of the uterus. An embryo begins to develop into a baby. At 6 to 8 weeks, the eyes and face are formed and the heartbeat can be seen on ultrasound. At the end of 12 weeks (first trimester), all the baby's organs are formed. Now that you are pregnant, you will want to do everything you can to have a healthy baby. Two of the most important things are to get good prenatal care and follow your caregiver's instructions. Prenatal care is all the medical care you receive before the baby's birth. It is given to prevent, find and treat problems during the pregnancy and childbirth. PRENATAL EXAMS:  During prenatal visits, your weight, blood pressure and urine are checked. This is done to make sure you are healthy and progressing normally during the pregnancy.   A pregnant woman should gain 25 to 35 pounds during the pregnancy. However, if you are over weight or underweight, your caregiver will advise you regarding your weight.   Your caregiver will ask and answer questions for you.   Blood work, cervical cultures, other necessary tests and a Pap test are done during your prenatal exams. These tests are done to check on your health and the probable health of your baby. Tests are strongly recommended and done for HIV with your permission. This is the virus that causes AIDS. These tests are done because medications can be given to help prevent your baby from being born with this infection should you have been infected without knowing it. Blood work is also used to find out your blood type, previous infections and follow your blood levels (hemoglobin).   Low hemoglobin (anemia) is common during pregnancy. Iron and vitamins are given to help prevent this. Later in the pregnancy,  blood tests for diabetes will be done along with any other tests if any problems develop. You may need tests to make sure you and the baby are doing well.   You may need other tests to make sure you and the baby are doing well.  CHANGES DURING THE FIRST TRIMESTER (THE FIRST 3 MONTHS OF PREGNANCY) Your body goes through many changes during pregnancy. They vary from person to person. Talk to your caregiver about changes you notice and are concerned about. Changes can include:  Your menstrual period stops.   The egg and sperm carry the genes that determine what you look like. Genes from you and your partner are forming a baby. The female genes determine whether the baby is a boy or a girl.   Your body increases in girth and you may feel bloated.   Feeling sick to your stomach (nauseous) and throwing up (vomiting). If the vomiting is uncontrollable, call your caregiver.   Your breasts will begin to enlarge and become tender.   Your nipples may stick out more and become darker.   The need to urinate more. Painful urination may mean you have a bladder infection.   Tiring easily.   Loss of appetite.   Cravings for certain kinds of food.   At first, you may gain or lose a couple of pounds.   You may have changes in your emotions from day to day (excited to be pregnant or concerned something may go wrong with the pregnancy and baby).     You may have more vivid and strange dreams.  HOME CARE INSTRUCTIONS  It is very important to avoid all smoking, alcohol and un-prescribed drugs during your pregnancy. These affect the formation and growth of the baby. Avoid chemicals while pregnant to ensure the delivery of a healthy infant.   Start your prenatal visits by the 12th week of pregnancy. They are usually scheduled monthly at first, then more often in the last 2 months before delivery. Keep your caregiver's appointments. Follow your caregiver's instructions regarding medication use, blood and lab  tests, exercise, and diet.   During pregnancy, you are providing food for you and your baby. Eat regular, well-balanced meals. Choose foods such as meat, fish, milk and other low fat dairy products, vegetables, fruits, and whole-grain breads and cereals. Your caregiver will tell you of the ideal weight gain.   You can help morning sickness by keeping soda crackers (saltines) at the bedside. Eat a couple before arising in the morning. You may want to use the crackers without salt on them.   Eating 4 to 5 small meals rather than 3 large meals a day also may help the nausea and vomiting.   Drinking liquids between meals instead of during meals also seems to help nausea and vomiting.   A physical sexual relationship may be continued throughout pregnancy if there are no other problems. Problems may be early (premature) leaking of amniotic fluid from the membranes, vaginal bleeding, or belly (abdominal) pain.   Exercise regularly if there are no restrictions. Check with your caregiver or physical therapist if you are unsure of the safety of some of your exercises. Greater weight gain will occur in the last 2 trimesters of pregnancy. Exercising will help:   Control your weight.   Keep you in shape.   Prepare you for labor and delivery.   Help you lose your pregnancy weight after you deliver your baby.   Wear a good support or jogging bra for breast tenderness during pregnancy. This may help if worn during sleep too.   Ask when prenatal classes are available. Begin classes when they are offered.   Do not use hot tubs, steam rooms or saunas.   Wear your seat belt when driving. This protects you and your baby if you are in an accident.   Avoid raw meat, uncooked cheese, cat litter boxes and soil used by cats throughout the pregnancy. These carry germs that can cause birth defects in the baby.   The first trimester is a good time to visit your dentist for your dental health. Getting your teeth  cleaned is OK. Use a softer toothbrush and brush gently during pregnancy.   Ask for help if you have financial, counseling or nutritional needs during pregnancy. Your caregiver will be able to offer counseling for these needs as well as refer you for other special needs.   Do not take any medications or herbs unless told by your caregiver.   Inform your caregiver if there is any mental or physical domestic violence.   Make a list of emergency phone numbers of family, friends, hospital, police and fire department.   Write down your questions. Take them to your prenatal visit.   Do not douche.   Do not cross your legs.   If you have to stand for long periods of time, rotate you feet or take small steps in a circle.   You may have more vaginal secretions that may require a sanitary pad. Do not use tampons   or scented sanitary pads.  MEDICATIONS AND DRUG USE IN PREGNANCY  Take prenatal vitamins as directed. The vitamin should contain 1 milligram of folic acid. Keep all vitamins out of reach of children. Only a couple vitamins or tablets containing iron may be fatal to a baby or young child when ingested.   Avoid use of all medications, including herbs, over-the-counter medications, not prescribed or suggested by your caregiver. Only take over-the-counter or prescription medicines for pain, discomfort, or fever as directed by your caregiver. Do not use aspirin, ibuprofen (Motrin, Advil, Nuprin) or naproxen (Aleve) unless OK'd by your caregiver.   Let your caregiver also know about herbs you may be using.   Alcohol is related to a number of birth defects. This includes fetal alcohol syndrome. All alcohol, in any form, should be avoided completely. Smoking will cause low birth rate and premature babies.   Street/illegal drugs are very harmful to the baby. They are absolutely forbidden. A baby born to an addicted mother will be addicted at birth. The baby will go through the same withdrawal  an adult does.   Let your caregiver know about any medications that you have to take and for what reason you take them.  MISCARRIAGE IS COMMON DURING PREGNANCY A miscarriage does not mean you did something wrong. It is not a reason to worry about getting pregnant again. Your caregiver will help you with questions you may have. If you have a miscarriage, you may need minor surgery (a D & C). SEEK MEDICAL CARE IF:  You have any concerns or worries during your pregnancy. It is better to call with your questions if you feel they cannot wait, rather than worry about them.  SEEK IMMEDIATE MEDICAL CARE IF:  An unexplained oral temperature above 100.4 develops, or as your caregiver suggests.   You have leaking of fluid from the vagina (birth canal). If leaking membranes are suspected, take your temperature and inform your caregiver of this when you call.   There is vaginal spotting or bleeding. Notify your caregiver of the amount and how many pads are used.   You develop a bad smelling vaginal discharge with a change in the color.   You continue to feel sick to your stomach (nauseated) and have no relief from remedies suggested. You vomit blood or coffee ground like materials.   You lose more than 2 pounds of weight in one week.   You gain more than 2 pounds of weight in a week and you notice swelling of your face, hands, feet or legs.   You gain 5 pounds or more in 1 week (even if you do not have swelling of your hands, face, legs or feet).   You get exposed to German measles and have never had them.   You are exposed to fifth disease or chicken pox.   You develop belly (abdominal) pain. Round ligament discomfort is a common non-cancerous (benign) cause of abdominal pain in pregnancy. Your caregiver still must evaluate this.   You develop headache, fever, diarrhea, pain with urination, or shortness of breath.   You fall, are in a car accident or have any kind of trauma.   There is mental  or physical violence in your home.  Document Released: 05/12/2001 Document Re-Released: 11/05/2009 [employer] Patient Information 2011 [employer]. 

## 2011-03-11 ENCOUNTER — Encounter: Payer: Self-pay | Admitting: Obstetrics & Gynecology

## 2011-03-11 ENCOUNTER — Ambulatory Visit (INDEPENDENT_AMBULATORY_CARE_PROVIDER_SITE_OTHER): Payer: Self-pay | Admitting: Obstetrics & Gynecology

## 2011-03-11 VITALS — BP 125/72 | Temp 98.4°F | Wt 135.0 lb

## 2011-03-11 DIAGNOSIS — IMO0002 Reserved for concepts with insufficient information to code with codable children: Secondary | ICD-10-CM

## 2011-03-11 DIAGNOSIS — Z8742 Personal history of other diseases of the female genital tract: Secondary | ICD-10-CM | POA: Insufficient documentation

## 2011-03-11 DIAGNOSIS — Z3480 Encounter for supervision of other normal pregnancy, unspecified trimester: Secondary | ICD-10-CM

## 2011-03-11 NOTE — Progress Notes (Signed)
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  Benign bx.  NO need to rpt colpo today.  Will repeat pap 6 weeks pp.  NO problems with pregnancy today.  Refused genetic screening

## 2011-03-11 NOTE — Progress Notes (Signed)
p-142  Pt wants to wait on Colpo until after pregnancy due to paying out of pocket  p-89

## 2011-03-27 ENCOUNTER — Encounter: Payer: Self-pay | Admitting: Family Medicine

## 2011-03-27 ENCOUNTER — Inpatient Hospital Stay (INDEPENDENT_AMBULATORY_CARE_PROVIDER_SITE_OTHER)
Admission: RE | Admit: 2011-03-27 | Discharge: 2011-03-27 | Disposition: A | Discharge status: Home / Self Care | Payer: BC Managed Care – PPO | Source: Ambulatory Visit | Attending: Family Medicine | Admitting: Family Medicine

## 2011-03-27 DIAGNOSIS — S61209A Unspecified open wound of unspecified finger without damage to nail, initial encounter: Secondary | ICD-10-CM

## 2011-03-30 IMAGING — US US PELVIS COMPLETE
1 series · 14 of 25 positions shown · non-contrast
Comparison: None.

CLINICAL DATA: Irregular bleeding



[Series 1: us pelvis complete · 0.26mm/px · 14 of 59 slices shown]
[im 1/59]
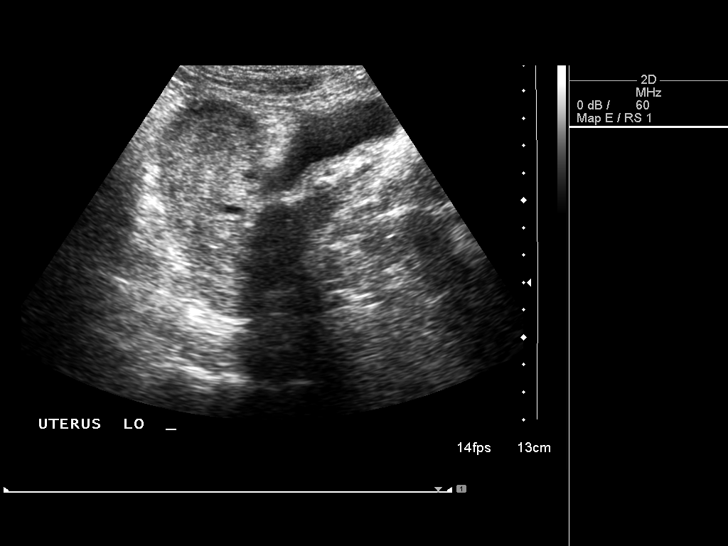
[im 5/59]
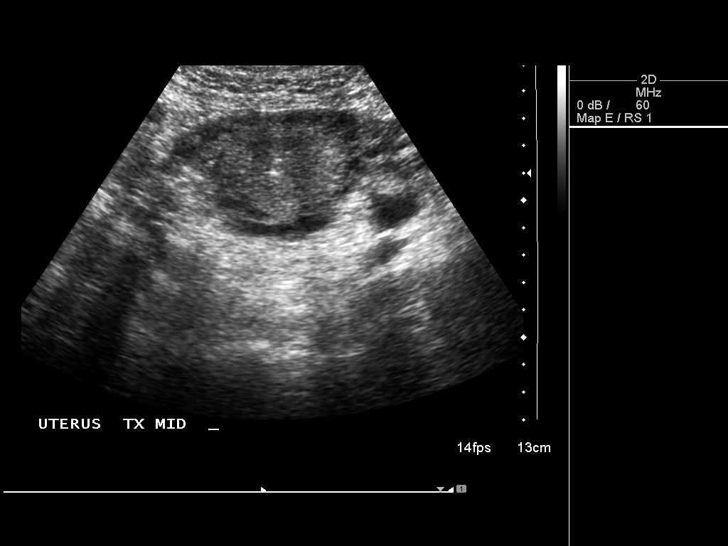
[im 10/59]
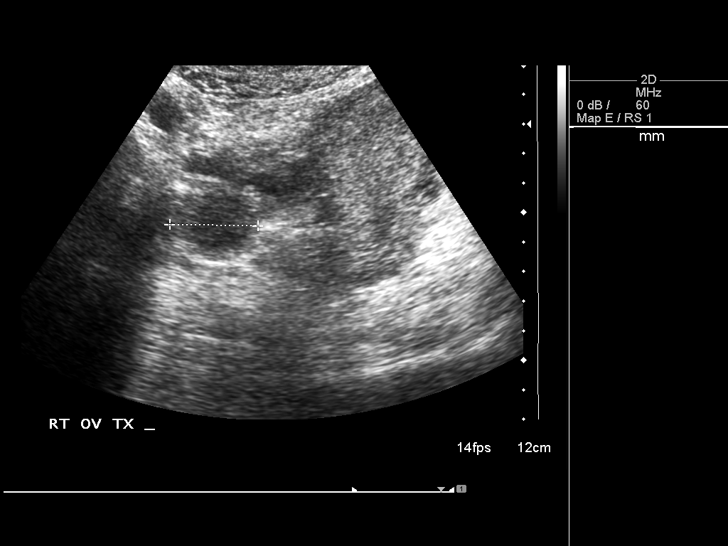
[im 15/59]
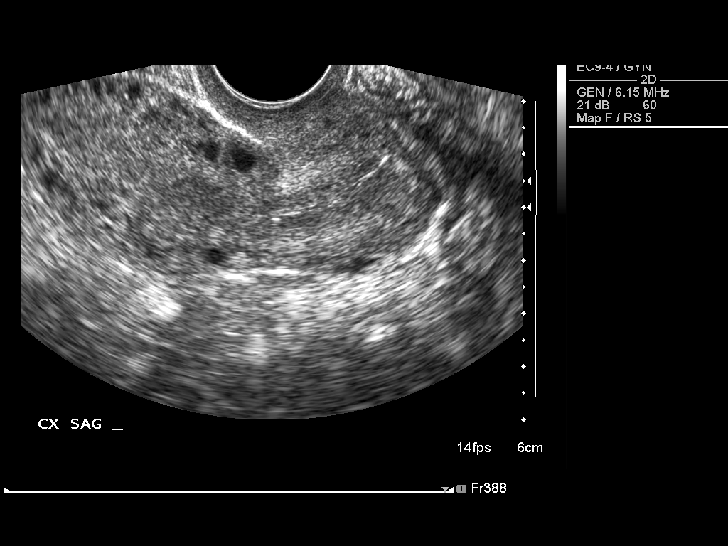
[im 20/59]
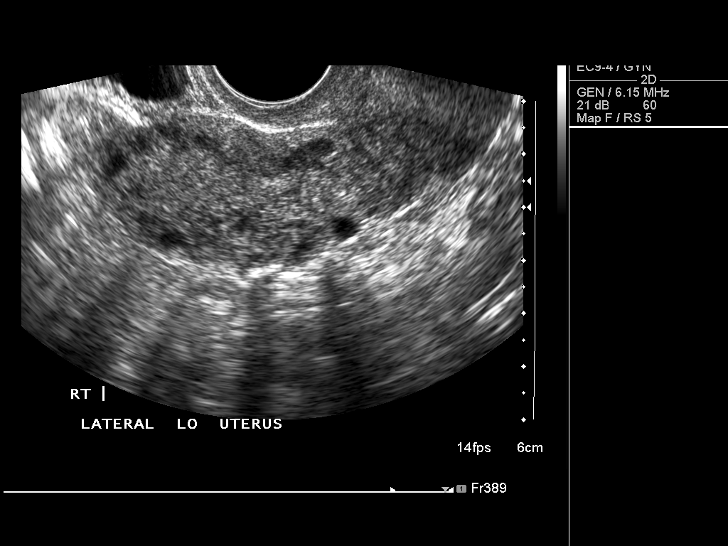
[im 22/59]
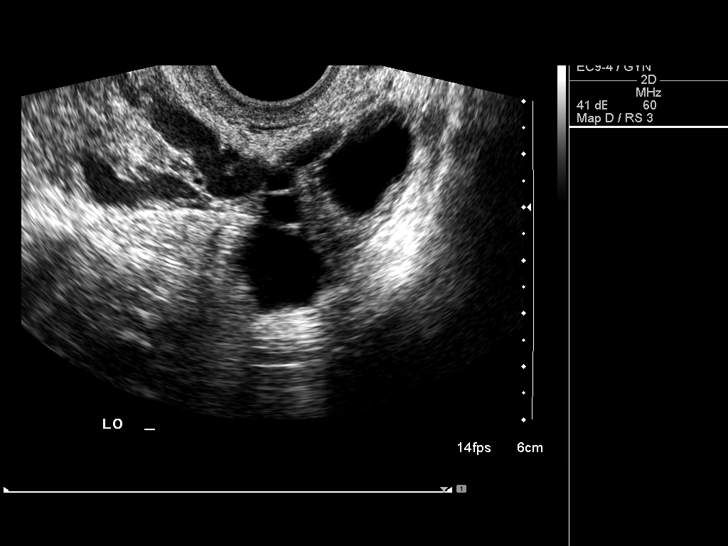
[im 27/59]
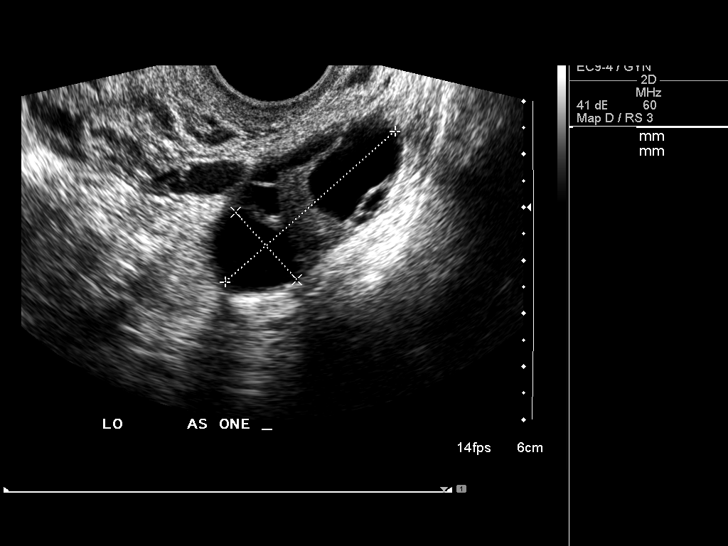
[im 32/59]
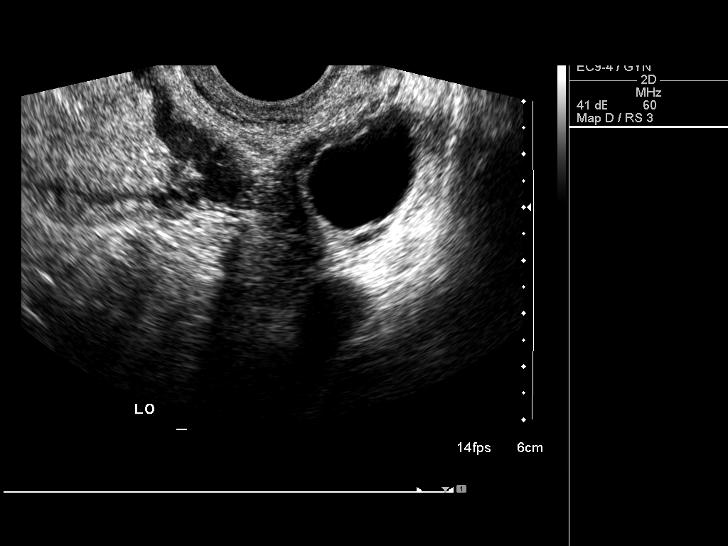
[im 37/59]
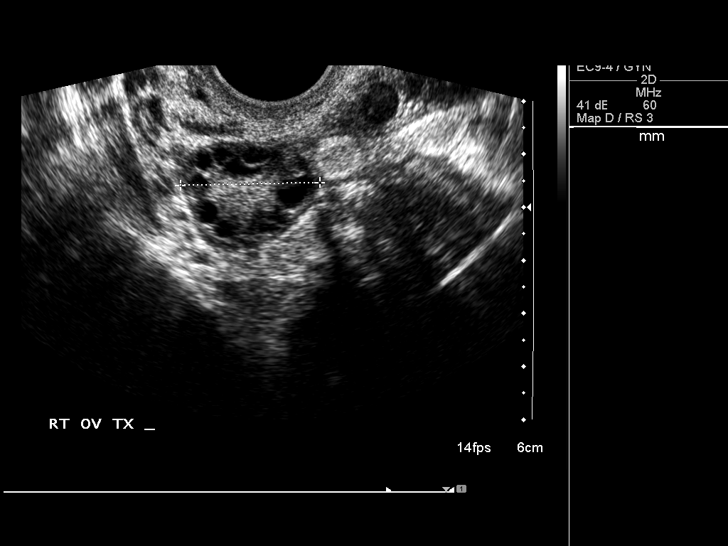
[im 39/59]
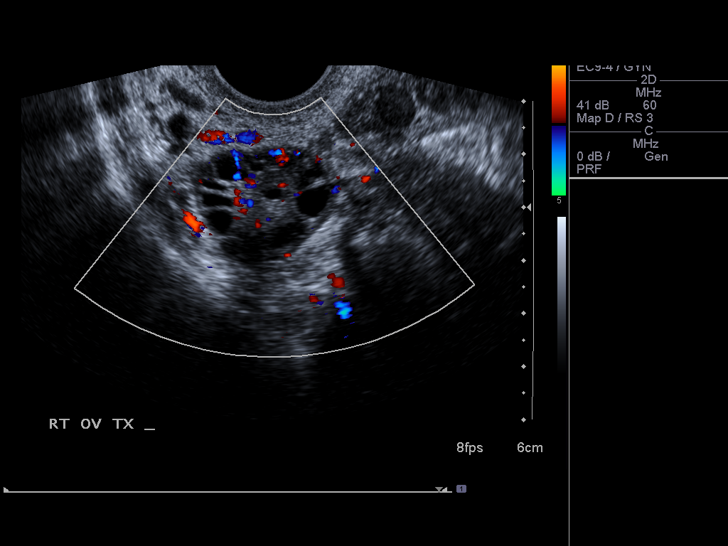
[im 44/59]
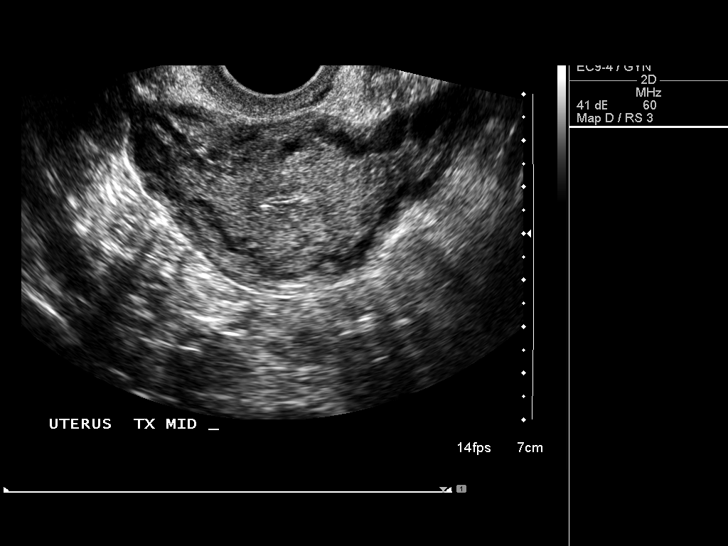
[im 49/59]
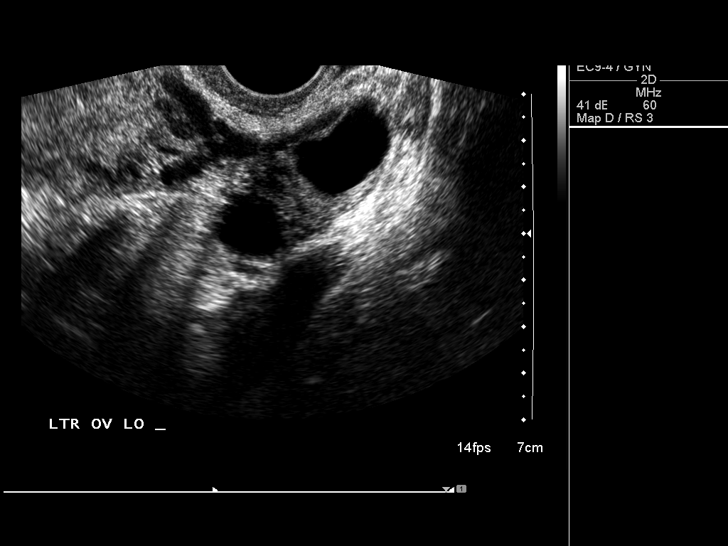
[im 54/59]
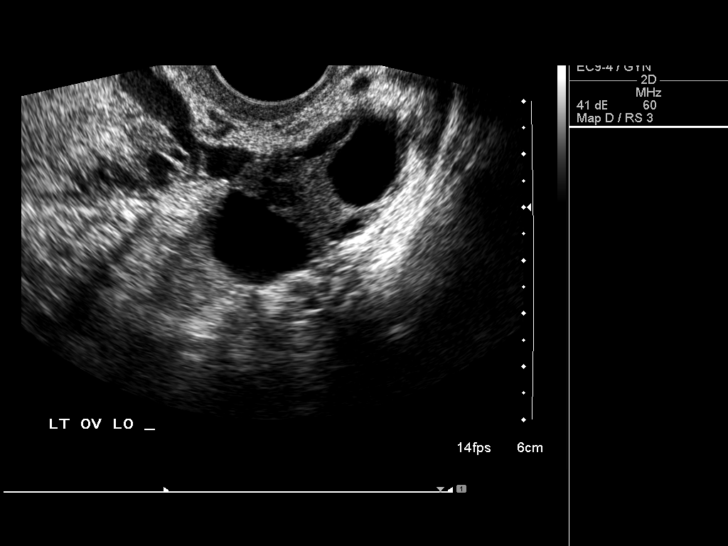
[im 59/59]
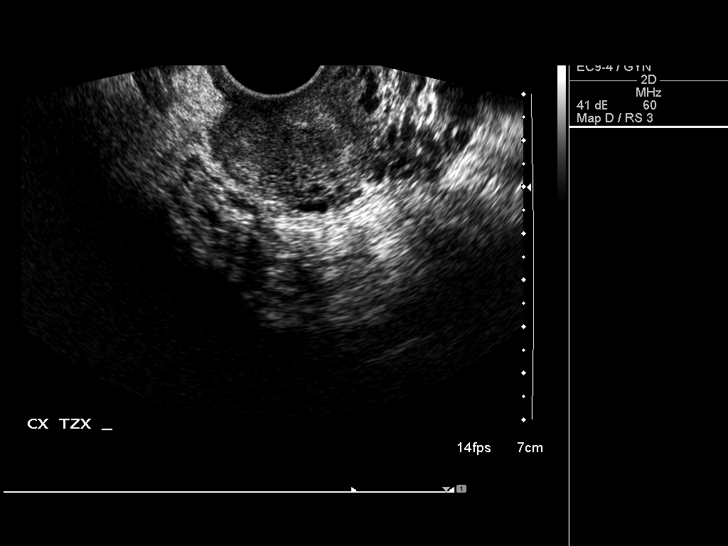

[14 of 25 positions shown; findings below may reference images not displayed]

FINDINGS: The uterus has a normal size and echotexture, measuring
8.2 x 3.8 x 4.9 cm.  The endometrial stripe is thin and
homogeneous, measuring 4 mm in width.  Both ovaries have a normal
size and appearance.  The right ovary measures 2.7 x 2.3 x 2.6 cm,
and the left ovary measures 5.2 x 2.3 x 4.1 cm.  There are no
adnexal masses or free pelvic fluid.
IMPRESSION: Normal pelvic ultrasound.

## 2011-04-08 ENCOUNTER — Encounter: Payer: Self-pay | Admitting: Obstetrics & Gynecology

## 2011-04-08 ENCOUNTER — Ambulatory Visit (INDEPENDENT_AMBULATORY_CARE_PROVIDER_SITE_OTHER): Payer: BC Managed Care – PPO | Admitting: Obstetrics & Gynecology

## 2011-04-08 VITALS — BP 123/65 | Temp 98.6°F | Wt 141.0 lb

## 2011-04-08 DIAGNOSIS — O26899 Other specified pregnancy related conditions, unspecified trimester: Secondary | ICD-10-CM

## 2011-04-08 DIAGNOSIS — Z3490 Encounter for supervision of normal pregnancy, unspecified, unspecified trimester: Secondary | ICD-10-CM

## 2011-04-08 DIAGNOSIS — O360990 Maternal care for other rhesus isoimmunization, unspecified trimester, not applicable or unspecified: Secondary | ICD-10-CM

## 2011-04-08 DIAGNOSIS — Z3480 Encounter for supervision of other normal pregnancy, unspecified trimester: Secondary | ICD-10-CM

## 2011-04-08 NOTE — Progress Notes (Signed)
p-97  Wants FLU vaccine today

## 2011-04-08 NOTE — Progress Notes (Signed)
Flu shot today, Tdap next week.  [place] scheduled for next week.  No issuew.

## 2011-04-10 ENCOUNTER — Ambulatory Visit (HOSPITAL_COMMUNITY): Payer: BC Managed Care – PPO

## 2011-04-13 ENCOUNTER — Ambulatory Visit (HOSPITAL_COMMUNITY)
Admission: RE | Admit: 2011-04-13 | Discharge: 2011-04-13 | Disposition: A | Discharge status: Home / Self Care | Payer: BC Managed Care – PPO | Source: Ambulatory Visit | Attending: Obstetrics & Gynecology | Admitting: Obstetrics & Gynecology

## 2011-04-13 DIAGNOSIS — Z363 Encounter for antenatal screening for malformations: Secondary | ICD-10-CM | POA: Insufficient documentation

## 2011-04-13 DIAGNOSIS — Z3490 Encounter for supervision of normal pregnancy, unspecified, unspecified trimester: Secondary | ICD-10-CM

## 2011-04-13 DIAGNOSIS — Z1389 Encounter for screening for other disorder: Secondary | ICD-10-CM | POA: Insufficient documentation

## 2011-04-13 DIAGNOSIS — O358XX0 Maternal care for other (suspected) fetal abnormality and damage, not applicable or unspecified: Secondary | ICD-10-CM | POA: Insufficient documentation

## 2011-05-04 NOTE — Progress Notes (Signed)
Summary: INJ...WSE procedure rm   Vital Signs:  Patient Profile:   30 Years Old Female CC:      LT 2nd finger laceration  x today Height:     67 inches Weight:      140.50 pounds O2 Sat:      99 % O2 treatment:    Room Air Temp:     98.4 degrees F oral Pulse rate:   86 / minute Resp:     16 per minute BP sitting:   130 / 84  (left arm) Cuff size:   regular  Vitals Entered By: Clemens Catholic LPN (March 27, 2011 7:48 PM)                  Updated Prior Medication List: PRENATAL VITAMINS 0.8 MG TABS (PRENATAL MULTIVIT-MIN-FE-FA) take one tablet by mouth twice a day PRENATAL MULTI +DHA 27-0.8-228 MG CAPS (PRENATAL VIT-FE FUM-FA-OMEGA)  VITAMIN B-6 100 MG TABS (PYRIDOXINE HCL)  UNISOM 25 MG TABS (DOXYLAMINE SUCCINATE (SLEEP))  CALCIUM 500 MG TABS (CALCIUM)   Current Allergies: No known allergies History of Present Illness Chief Complaint: LT 2nd finger laceration  x today History of Present Illness:  Subjective:  While carving a pumpkin today, patient stabbed the dorsum of her left second finger, penetrating the finger though and through.  She has had tetanus immunization in past but does not remember date.  She is [redacted] weeks pregnant.  REVIEW OF SYSTEMS Constitutional Symptoms      Denies fever, chills, night sweats, weight loss, weight gain, and fatigue.  Eyes       Denies change in vision, eye pain, eye discharge, glasses, contact lenses, and eye surgery. Ear/Nose/Throat/Mouth       Denies hearing loss/aids, change in hearing, ear pain, ear discharge, dizziness, frequent runny nose, frequent nose bleeds, sinus problems, sore throat, hoarseness, and tooth pain or bleeding.  Respiratory       Denies dry cough, productive cough, wheezing, shortness of breath, asthma, bronchitis, and emphysema/COPD.  Cardiovascular       Denies murmurs, chest pain, and tires easily with exhertion.    Gastrointestinal       Denies stomach pain, nausea/vomiting, diarrhea, constipation,  blood in bowel movements, and indigestion. Genitourniary       Denies painful urination, kidney stones, and loss of urinary control. Neurological       Denies paralysis, seizures, and fainting/blackouts. Musculoskeletal       Denies muscle pain, joint pain, joint stiffness, decreased range of motion, redness, swelling, muscle weakness, and gout.  Skin       Denies bruising, unusual mles/lumps or sores, and hair/skin or nail changes.  Psych       Denies mood changes, temper/anger issues, anxiety/stress, speech problems, depression, and sleep problems. Other Comments: pt c/o LT 2nd finger laceration x today, stabbed it with a pumpkin carving  knife. she is [redacted]wks pregnant. unsure of her last Tdap.   Past History:  Past Medical History: Reviewed history from 07/25/2010 and no changes required. Gyn- [place].  Hx of recurrent colposcopy.   Past Surgical History: Reviewed history from 07/25/2010 and no changes required. Lasik 2004 tonsillectomy 2002  Family History: Reviewed history from 07/25/2010 and no changes required. MGF with brain tumor Mother died age 46 MI, depression,  PGF alzheimer, MGM stroke  Social History: Reviewed history from 07/25/2010 and no changes required. 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  Married to [employer] with 2 kids.   Former Smoker Alcohol use-yes,  2-3 per week.  Drug use-no Regular exercise-no 20 oz soda a day.     Objective:  Appearance:  Patient appears healthy, stated age, and in no acute distress Left second finger:  Full range of motion all joints.  Distal neurovascular intact.  On the dorsal radial surface of PIP  joint there is a superficial laceration 7mm long.  On the opposite dorsal surface of PIP joint ia a superficial 5mm laceration.  Wound appears clean without debris.  No swelling. Assessment New Problems: LACERATION, FINGER (ICD-883.0)  WOUND BY A NARROW KNIFE APPEARS TO HAVE COMPLETELY PENETRATED DORSAL SURFACE OF  FINGER SUPERFICIALLY OVER PIP JOINT.  PIP JOINT HAS NO COMPROMISE OF FUNCTION, AND DISTAL NEUROVASCULAR FUNCTION INTACT.  Plan New Orders: New Patient Level III [99203] Repair Superficial Wound(s) 2.5cm or <(scalp,neck,axillae,ext gent,trunk,extre) [12001] Services provided After hours-Weekends-Holidays [99051] Planning Comments:   Patient has had tetanus immunization in the past, although she does not remember when.  Because she is not yet at twenty weeks gestation will not administer Tdap. Advised to change bandage daily.  Keep wound clean and dry.  Return 10 days for suture removal. Wound precautions discussed; return for any signs of infection.   The patient and/or caregiver has been counseled thoroughly with regard to medications prescribed including dosage, schedule, interactions, rationale for use, and possible side effects and they verbalize understanding.  Diagnoses and expected course of recovery discussed and will return if not improved as expected or if the condition worsens. Patient and/or caregiver verbalized understanding.   PROCEDURE:  Suture Site: Left second finger Size: 7mm and 5mm Number of Lacerations: Two Anesthesia: Digital 2% plain lidocaine Procedure: Procedure:  Laceration Repair Discussed benefits and risks of procedure and verbal consent obtained. Using sterile technique and  digital 2% lidocaine without epinephrine, cleansed wounds with Betadine followed by copious lavage with normal saline.  Wounds carefully inspected for debris and foreign bodies; none found.  Wound #1 (7mm) closed with #3 , 5-0 interrupted nylon sutures.  Wound #2 (5mm) closed with #1, 5-0 interrupted nylon sutures.  Non-stick sterile dressing applied.  Wound precautions explained to patient.     Orders Added: 1)  New Patient Level III [96295] 2)  Repair Superficial Wound(s) 2.5cm or <(scalp,neck,axillae,ext gent,trunk,extre) [12001] 3)  Services provided After hours-Weekends-Holidays  [28413]

## 2011-05-11 ENCOUNTER — Other Ambulatory Visit: Payer: Self-pay | Admitting: *Deleted

## 2011-05-11 MED ORDER — ACYCLOVIR 400 MG PO TABS: 400.0000 mg | ORAL_TABLET | Freq: Two times a day (BID) | ORAL | Status: DC

## 2011-05-11 NOTE — Telephone Encounter (Signed)
Pharmacy called with RF request of Acyclovir 400mg  BID.  Per office notes RF request granted.

## 2011-05-15 ENCOUNTER — Ambulatory Visit (INDEPENDENT_AMBULATORY_CARE_PROVIDER_SITE_OTHER): Payer: BC Managed Care – PPO | Admitting: Family

## 2011-05-15 DIAGNOSIS — Z3480 Encounter for supervision of other normal pregnancy, unspecified trimester: Secondary | ICD-10-CM

## 2011-05-15 NOTE — Progress Notes (Signed)
p-101 Has a varicose vein Lt leg which is very painful.

## 2011-05-15 NOTE — Progress Notes (Signed)
Reports continued pain with varicose vein>RX written for compression hose; reviewed [place] results (poor viewing of RVOT), pt declines repeat US.  No other questions or concerns.

## 2011-06-02 NOTE — L&D Delivery Note (Signed)
Delivery Note At 2:47 PM a viable and healthy female was delivered via Vaginal, Spontaneous Delivery (Presentation: Left Occiput Anterior).  APGAR: 9, 9; weight .   Placenta status: Intact, Spontaneous.  Cord: 3 vessels with the following complications: None.  Cord pH: n/a  Anesthesia: None  Episiotomy:  Lacerations: right labial abrasion Suture Repair: n/a Est. Blood Loss (mL): 350  Mom to postpartum.  Baby to nursery-stable.  [facility] 08/22/2011, 3:12 PM

## 2011-06-12 ENCOUNTER — Ambulatory Visit (INDEPENDENT_AMBULATORY_CARE_PROVIDER_SITE_OTHER): Payer: Self-pay | Admitting: Physician Assistant

## 2011-06-12 ENCOUNTER — Encounter: Payer: Self-pay | Admitting: Physician Assistant

## 2011-06-12 VITALS — BP 114/61 | Temp 98.0°F | Wt 148.0 lb

## 2011-06-12 DIAGNOSIS — O360990 Maternal care for other rhesus isoimmunization, unspecified trimester, not applicable or unspecified: Secondary | ICD-10-CM

## 2011-06-12 DIAGNOSIS — Z3480 Encounter for supervision of other normal pregnancy, unspecified trimester: Secondary | ICD-10-CM

## 2011-06-12 DIAGNOSIS — Z6791 Unspecified blood type, Rh negative: Secondary | ICD-10-CM

## 2011-06-12 DIAGNOSIS — O2200 Varicose veins of lower extremity in pregnancy, unspecified trimester: Secondary | ICD-10-CM

## 2011-06-12 HISTORY — DX: Varicose veins of lower extremity in pregnancy, unspecified trimester: O22.00

## 2011-06-12 MED ORDER — RHO D IMMUNE GLOBULIN 1500 UNIT/2ML IJ SOLN
300.0000 ug | Freq: Once | INTRAMUSCULAR | Status: AC
  Administered 2011-06-12: 300 ug via INTRAMUSCULAR

## 2011-06-12 NOTE — Progress Notes (Signed)
Addended by: Granville Lewis on: 06/12/2011 09:36 AM   Modules accepted: Orders

## 2011-06-12 NOTE — Progress Notes (Signed)
Addended by: Granville Lewis on: 06/12/2011 11:32 AM   Modules accepted: Orders

## 2011-06-12 NOTE — Progress Notes (Signed)
Continued pain with varicose veins, improving with compression stockings. REC: mat support belt also. Brst exam: neg. Performed d/t husband ? Felt lump, pt has felt no abnormality. Glucola today

## 2011-06-12 NOTE — Progress Notes (Signed)
p=101 

## 2011-06-12 NOTE — Patient Instructions (Signed)
Pregnancy - Third Trimester The third trimester of pregnancy (the last 3 months) is a period of the most rapid growth for you and your baby. The baby approaches a length of 20 inches and a weight of 6 to 10 pounds. The baby is adding on fat and getting ready for life outside your body. While inside, babies have periods of sleeping and waking, suck their thumbs, and hiccups. You can often feel small contractions of the uterus. This is false labor. It is also called Braxton-Hicks contractions. This is like a practice for labor. The usual problems in this stage of pregnancy include more difficulty breathing, swelling of the hands and feet from water retention, and having to urinate more often because of the uterus and baby pressing on your bladder.  PRENATAL EXAMS  Blood work may continue to be done during prenatal exams. These tests are done to check on your health and the probable health of your baby. Blood work is used to follow your blood levels (hemoglobin). Anemia (low hemoglobin) is common during pregnancy. Iron and vitamins are given to help prevent this. You may also continue to be checked for diabetes. Some of the past blood tests may be done again.   The size of the uterus is measured during each visit. This makes sure your baby is growing properly according to your pregnancy dates.   Your blood pressure is checked every prenatal visit. This is to make sure you are not getting toxemia.   Your urine is checked every prenatal visit for infection, diabetes and protein.   Your weight is checked at each visit. This is done to make sure gains are happening at the suggested rate and that you and your baby are growing normally.   Sometimes, an ultrasound is performed to confirm the position and the proper growth and development of the baby. This is a test done that bounces harmless sound waves off the baby so your caregiver can more accurately determine due dates.   Discuss the type of pain  medication and anesthesia you will have during your labor and delivery.   Discuss the possibility and anesthesia if a Cesarean Section might be necessary.   Inform your caregiver if there is any mental or physical violence at home.  Sometimes, a specialized non-stress test, contraction stress test and biophysical profile are done to make sure the baby is not having a problem. Checking the amniotic fluid surrounding the baby is called an amniocentesis. The amniotic fluid is removed by sticking a needle into the belly (abdomen). This is sometimes done near the end of pregnancy if an early delivery is required. In this case, it is done to help make sure the baby's lungs are mature enough for the baby to live outside of the womb. If the lungs are not mature and it is unsafe to deliver the baby, an injection of cortisone medication is given to the mother 1 to 2 days before the delivery. This helps the baby's lungs mature and makes it safer to deliver the baby. CHANGES OCCURING IN THE THIRD TRIMESTER OF PREGNANCY Your body goes through many changes during pregnancy. They vary from person to person. Talk to your caregiver about changes you notice and are concerned about.  During the last trimester, you have probably had an increase in your appetite. It is normal to have cravings for certain foods. This varies from person to person and pregnancy to pregnancy.   You may begin to get stretch marks on your hips,   abdomen, and breasts. These are normal changes in the body during pregnancy. There are no exercises or medications to take which prevent this change.   Constipation may be treated with a stool softener or adding bulk to your diet. Drinking lots of fluids, fiber in vegetables, fruits, and whole grains are helpful.   Exercising is also helpful. If you have been very active up until your pregnancy, most of these activities can be continued during your pregnancy. If you have been less active, it is helpful  to start an exercise program such as walking. Consult your caregiver before starting exercise programs.   Avoid all smoking, alcohol, un-prescribed drugs, herbs and "street drugs" during your pregnancy. These chemicals affect the formation and growth of the baby. Avoid chemicals throughout the pregnancy to ensure the delivery of a healthy infant.   Backache, varicose veins and hemorrhoids may develop or get worse.   You will tire more easily in the third trimester, which is normal.   The baby's movements may be stronger and more often.   You may become short of breath easily.   Your belly button may stick out.   A yellow discharge may leak from your breasts called colostrum.   You may have a bloody mucus discharge. This usually occurs a few days to a week before labor begins.  HOME CARE INSTRUCTIONS   Keep your caregiver's appointments. Follow your caregiver's instructions regarding medication use, exercise, and diet.   During pregnancy, you are providing food for you and your baby. Continue to eat regular, well-balanced meals. Choose foods such as meat, fish, milk and other low fat dairy products, vegetables, fruits, and whole-grain breads and cereals. Your caregiver will tell you of the ideal weight gain.   A physical sexual relationship may be continued throughout pregnancy if there are no other problems such as early (premature) leaking of amniotic fluid from the membranes, vaginal bleeding, or belly (abdominal) pain.   Exercise regularly if there are no restrictions. Check with your caregiver if you are unsure of the safety of your exercises. Greater weight gain will occur in the last 2 trimesters of pregnancy. Exercising helps:   Control your weight.   Get you in shape for labor and delivery.   You lose weight after you deliver.   Rest a lot with legs elevated, or as needed for leg cramps or low back pain.   Wear a good support or jogging bra for breast tenderness during  pregnancy. This may help if worn during sleep. Pads or tissues may be used in the bra if you are leaking colostrum.   Do not use hot tubs, steam rooms, or saunas.   Wear your seat belt when driving. This protects you and your baby if you are in an accident.   Avoid raw meat, cat litter boxes and soil used by cats. These carry germs that can cause birth defects in the baby.   It is easier to loose urine during pregnancy. Tightening up and strengthening the pelvic muscles will help with this problem. You can practice stopping your urination while you are going to the bathroom. These are the same muscles you need to strengthen. It is also the muscles you would use if you were trying to stop from passing gas. You can practice tightening these muscles up 10 times a set and repeating this about 3 times per day. Once you know what muscles to tighten up, do not perform these exercises during urination. It is more likely   to cause an infection by backing up the urine.   Ask for help if you have financial, counseling or nutritional needs during pregnancy. Your caregiver will be able to offer counseling for these needs as well as refer you for other special needs.   Make a list of emergency phone numbers and have them available.   Plan on getting help from family or friends when you go home from the hospital.   Make a trial run to the hospital.   Take prenatal classes with the father to understand, practice and ask questions about the labor and delivery.   Prepare the baby's room/nursery.   Do not travel out of the city unless it is absolutely necessary and with the advice of your caregiver.   Wear only low or no heal shoes to have better balance and prevent falling.  MEDICATIONS AND DRUG USE IN PREGNANCY  Take prenatal vitamins as directed. The vitamin should contain 1 milligram of folic acid. Keep all vitamins out of reach of children. Only a couple vitamins or tablets containing iron may be fatal  to a baby or young child when ingested.   Avoid use of all medications, including herbs, over-the-counter medications, not prescribed or suggested by your caregiver. Only take over-the-counter or prescription medicines for pain, discomfort, or fever as directed by your caregiver. Do not use aspirin, ibuprofen (Motrin, Advil, Nuprin) or naproxen (Aleve) unless OK'd by your caregiver.   Let your caregiver also know about herbs you may be using.   Alcohol is related to a number of birth defects. This includes fetal alcohol syndrome. All alcohol, in any form, should be avoided completely. Smoking will cause low birth rate and premature babies.   Street/illegal drugs are very harmful to the baby. They are absolutely forbidden. A baby born to an addicted mother will be addicted at birth. The baby will go through the same withdrawal an adult does.  SEEK MEDICAL CARE IF: You have any concerns or worries during your pregnancy. It is better to call with your questions if you feel they cannot wait, rather than worry about them. DECISIONS ABOUT CIRCUMCISION You may or may not know the sex of your baby. If you know your baby is a boy, it may be time to think about circumcision. Circumcision is the removal of the foreskin of the penis. This is the skin that covers the sensitive end of the penis. There is no proven medical need for this. Often this decision is made on what is popular at the time or based upon religious beliefs and social issues. You can discuss these issues with your caregiver or pediatrician. SEEK IMMEDIATE MEDICAL CARE IF:   An unexplained oral temperature above 102 F (38.9 C) develops, or as your caregiver suggests.   You have leaking of fluid from the vagina (birth canal). If leaking membranes are suspected, take your temperature and tell your caregiver of this when you call.   There is vaginal spotting, bleeding or passing clots. Tell your caregiver of the amount and how many pads are  used.   You develop a bad smelling vaginal discharge with a change in the color from clear to white.   You develop vomiting that lasts more than 24 hours.   You develop chills or fever.   You develop shortness of breath.   You develop burning on urination.   You loose more than 2 pounds of weight or gain more than 2 pounds of weight or as suggested by your   caregiver.   You notice sudden swelling of your face, hands, and feet or legs.   You develop belly (abdominal) pain. Round ligament discomfort is a common non-cancerous (benign) cause of abdominal pain in pregnancy. Your caregiver still must evaluate you.   You develop a severe headache that does not go away.   You develop visual problems, blurred or double vision.   If you have not felt your baby move for more than 1 hour. If you think the baby is not moving as much as usual, eat something with sugar in it and lie down on your left side for an hour. The baby should move at least 4 to 5 times per hour. Call right away if your baby moves less than that.   You fall, are in a car accident or any kind of trauma.   There is mental or physical violence at home.  Document Released: 05/12/2001 Document Revised: 01/28/2011 Document Reviewed: 11/14/2008 [employer] Patient Information 2012 [employer]. 

## 2011-06-12 NOTE — Progress Notes (Signed)
Addended by: August Luz on: 06/12/2011 10:19 AM   Modules accepted: Orders

## 2011-06-13 LAB — CBC
MCV: 91.9 fL (ref 78.0–100.0)
Platelets: 207 10*3/uL (ref 150–400)
RDW: 13.8 % (ref 11.5–15.5)
WBC: 6.4 10*3/uL (ref 4.0–10.5)

## 2011-06-26 ENCOUNTER — Ambulatory Visit (INDEPENDENT_AMBULATORY_CARE_PROVIDER_SITE_OTHER): Payer: BC Managed Care – PPO | Admitting: Family

## 2011-06-26 DIAGNOSIS — Z3480 Encounter for supervision of other normal pregnancy, unspecified trimester: Secondary | ICD-10-CM

## 2011-06-26 NOTE — Progress Notes (Signed)
Reports significant improvement with varicose veins; +hemorrhoids>exam (no bleeding, small)>Proctofoam; Reviewed 28 wk lab results

## 2011-06-26 NOTE — Progress Notes (Signed)
p-109  Hemorrhoids active

## 2011-07-10 ENCOUNTER — Ambulatory Visit (INDEPENDENT_AMBULATORY_CARE_PROVIDER_SITE_OTHER): Payer: BC Managed Care – PPO | Admitting: Family

## 2011-07-10 DIAGNOSIS — Z3480 Encounter for supervision of other normal pregnancy, unspecified trimester: Secondary | ICD-10-CM

## 2011-07-10 NOTE — Progress Notes (Signed)
Improvement in hemorrhoids; no questions or concerns;

## 2011-07-10 NOTE — Progress Notes (Signed)
p-96  Signed up for Water birth class

## 2011-07-24 ENCOUNTER — Ambulatory Visit (INDEPENDENT_AMBULATORY_CARE_PROVIDER_SITE_OTHER): Payer: BC Managed Care – PPO | Admitting: Family

## 2011-07-24 VITALS — BP 127/73 | Temp 98.0°F | Wt 156.0 lb

## 2011-07-24 DIAGNOSIS — Z3480 Encounter for supervision of other normal pregnancy, unspecified trimester: Secondary | ICD-10-CM

## 2011-07-24 DIAGNOSIS — M545 Low back pain, unspecified: Secondary | ICD-10-CM

## 2011-07-24 NOTE — Progress Notes (Signed)
Report increase in contractions; no bleeding or leaking of fluid; cervix closed/thick/long; recommended going to hospital for eval if they change in intensity or increase in number

## 2011-07-24 NOTE — Progress Notes (Signed)
p=95 

## 2011-07-26 LAB — CULTURE, URINE COMPREHENSIVE: Colony Count: NO GROWTH

## 2011-08-07 ENCOUNTER — Ambulatory Visit (INDEPENDENT_AMBULATORY_CARE_PROVIDER_SITE_OTHER): Payer: BC Managed Care – PPO | Admitting: Physician Assistant

## 2011-08-07 VITALS — BP 118/71 | Temp 97.4°F | Wt 156.0 lb

## 2011-08-07 DIAGNOSIS — Z3480 Encounter for supervision of other normal pregnancy, unspecified trimester: Secondary | ICD-10-CM

## 2011-08-07 LAB — STREP B DNA PROBE: GBS: NEGATIVE

## 2011-08-07 NOTE — Patient Instructions (Signed)
Nat Natural Childbirth Natural childbirth is going through labor and delivery without any drugs to relieve pain. You also do not use fetal monitors, have a cesarean delivery, or get a sugical cut to enlarge the vaginal opening (episiotomy). With the help of a birthing professional (midwife), you will direct your own labor and delivery as you choose. Many women chose natural childbirth because they feel more in control and in touch with their labor and delivery. They are also concerned about the medications affecting themselves and the baby. Pregnant women with a high risk pregnancy should not attempt natural childbirth. It is better to deliver the infant in a hospital if an emergency situation arises. Sometimes, the caregiver has to intervene for the health and safety of the mother and infant. TWO TECHNIQUES FOR NATURAL CHILDBIRTH:   The Lamaze method. This method teaches women that having a baby is normal, healthy, and natural. It also teaches the mother to take a neutral position regarding pain medication and anesthesia and to make an informed decision if and when it is right for them.   The Erven Colla (also called husband coached birth). This method teaches the father to be the birth coach and stresses a natural approach. It also encourages exercise and a balanced diet with good nutrition. The exercises teach relaxation and deep breathing techniques. However, there are also classes to prepare the parents for an emergency situation that may occur.  METHODS OF DEALING WITH LABOR PAIN AND DELIVERY:  Meditation.   Yoga.   Hypnosis.   Acupuncture.   Massage.   Changing positions (walking, rocking, showering, leaning on birth balls).   Lying in warm water or a jacuzzi.   Find an activity that keeps your mind off of the labor pain.   Listen to soft music.   Visual imagery (focus on a particular object).  BEFORE GOING INTO LABOR  Be sure you and your spouse/partner are in agreement to  have natural childbirth.   Decide if your caregiver or a midwife will deliver your baby.   Decide if you will have your baby in the hospital, birthing center, or at home.   If you have children, make plans to have someone to take care of them when you go to the hospital.   Know the distance and the time it takes to go to the delivery center. Make a dry run to be sure.   Have a bag packed with a night gown, bathrobe, and toiletries ready to take when you go into labor.   Keep phone numbers of your family and friends handy if you need to call someone when you go into labor.   Your spouse or partner should go to all the teaching classes.   Talk with your caregiver about the possibility of a medical emergency and what will happen if that occurs.  ADVANTAGES OF NATURAL CHILDBIRTH  You are in control of your labor and delivery.   It is safe.   There are no medications or anesthetics that may affect you and the fetus.   There are no invasive procedures such as an episiotomy.   You and your partner will work together, which can increase your bond.   Meditation, yoga, massage, and breathing exercises can be learned while pregnant and help you when you are in labor and at delivery.   In most delivery centers, the family and friends can be involved in the labor and delivery process.  DISADVANTAGES OF NATURAL CHILDBIRTH  You will experience pain  during your labor and delivery.   The methods of helping relieve your labor pains may not work for you.   You may feel embarrassed, disappointed, and like a failure if you decide to change your mind during labor and not have natural childbirth.  AFTER THE DELIVERY  You will be very tired.   You will be uncomfortable because of your uterus contracting. You will feel soreness around the vagina.   You may feel cold and shaky.This is a natural reaction.   You will be excited, overwhelmed, accomplished, and proud to be a mother.  HOME CARE  INSTRUCTIONS   Follow the advice and instructions of your caregiver.   Follow the instructions of your natural childbirth instructor (Lamaze or Bradley Method).  Document Released: 04/30/2008 Document Revised: 05/07/2011 Document Reviewed: 04/30/2008 [facility] Patient Information 2012 [place], [place].

## 2011-08-07 NOTE — Progress Notes (Signed)
p-94 

## 2011-08-07 NOTE — Progress Notes (Signed)
No complaints. +FM. OP presentation on leopold's, comfort measures reviewed. Planning waterbirth. Labor precautions.

## 2011-08-10 LAB — CULTURE, BETA STREP (GROUP B ONLY)

## 2011-08-12 ENCOUNTER — Telehealth: Payer: Self-pay | Admitting: Emergency Medicine

## 2011-08-12 NOTE — Telephone Encounter (Signed)
Spoke with Janice Aguilar regarding headaches she has been having.  She states she feels they are likely sinus related, but she wanted to be sure there was nothing to be concerned about.  She denies any visual changes, abd pain, swelling of hands or face, no pounding of her head, headaches are not incapacitating.  She states she has had a couple this week.  One last night that Tylenol did not help.  Has a ROB appt on Friday.  Advised her to keep appt on Friday, Tylenol as needed.  She is agreeable

## 2011-08-14 ENCOUNTER — Ambulatory Visit (INDEPENDENT_AMBULATORY_CARE_PROVIDER_SITE_OTHER): Payer: BC Managed Care – PPO | Admitting: Advanced Practice Midwife

## 2011-08-14 DIAGNOSIS — Z3480 Encounter for supervision of other normal pregnancy, unspecified trimester: Secondary | ICD-10-CM

## 2011-08-14 NOTE — Progress Notes (Signed)
p-97 

## 2011-08-14 NOTE — Patient Instructions (Signed)
Normal Labor and Delivery Your caregiver must first be sure you are in labor. Signs of labor include:  You may pass what is called "the mucus plug" before labor begins. This is a small amount of blood stained mucus.   Regular uterine contractions.   The time between contractions get closer together.   The discomfort and pain gradually gets more intense.   Pains are mostly located in the back.   Pains get worse when walking.   The cervix (the opening of the uterus becomes thinner (begins to efface) and opens up (dilates).  Once you are in labor and admitted into the hospital or care center, your caregiver will do the following:  A complete physical examination.   Check your vital signs (blood pressure, pulse, temperature and the fetal heart rate).   Do a vaginal examination (using a sterile glove and lubricant) to determine:   The position (presentation) of the baby (head [vertex] or buttock first).   The level (station) of the baby's head in the birth canal.   The effacement and dilatation of the cervix.   You may have your pubic hair shaved and be given an enema depending on your caregiver and the circumstance.   An electronic monitor is usually placed on your abdomen. The monitor follows the length and intensity of the contractions, as well as the baby's heart rate.   Usually, your caregiver will insert an IV in your arm with a bottle of sugar water. This is done as a precaution so that medications can be given to you quickly during labor or delivery.  NORMAL LABOR AND DELIVERY IS DIVIDED UP INTO 3 STAGES: First Stage This is when regular contractions begin and the cervix begins to efface and dilate. This stage can last from 3 to 15 hours. The end of the first stage is when the cervix is 100% effaced and 10 centimeters dilated. Pain medications may be given by   Injection (morphine, demerol, etc.)   Regional anesthesia (spinal, caudal or epidural, anesthetics given in  different locations of the spine). Paracervical pain medication may be given, which is an injection of and anesthetic on each side of the cervix.  A pregnant woman may request to have "Natural Childbirth" which is not to have any medications or anesthesia during her labor and delivery. Second Stage This is when the baby comes down through the birth canal (vagina) and is born. This can take 1 to 4 hours. As the baby's head comes down through the birth canal, you may feel like you are going to have a bowel movement. You will get the urge to bear down and push until the baby is delivered. As the baby's head is being delivered, the caregiver will decide if an episiotomy (a cut in the perineum and vagina area) is needed to prevent tearing of the tissue in this area. The episiotomy is sewn up after the delivery of the baby and placenta. Sometimes a mask with nitrous oxide is given for the mother to breath during the delivery of the baby to help if there is too much pain. The end of Stage 2 is when the baby is fully delivered. Then when the umbilical cord stops pulsating it is clamped and cut. Third Stage The third stage begins after the baby is completely delivered and ends after the placenta (afterbirth) is delivered. This usually takes 5 to 30 minutes. After the placenta is delivered, a medication is given either by intravenous or injection to help contract   the uterus and prevent bleeding. The third stage is not painful and pain medication is usually not necessary. If an episiotomy was done, it is repaired at this time. After the delivery, the mother is watched and monitored closely for 1 to 2 hours to make sure there is no postpartum bleeding (hemorrhage). If there is a lot of bleeding, medication is given to contract the uterus and stop the bleeding. Document Released: 02/25/2008 Document Revised: 05/07/2011 Document Reviewed: 02/25/2008 ExitCare Patient Information 2012 [employer]. 

## 2011-08-14 NOTE — Progress Notes (Signed)
Doing well. Prepared for waterbirth. Tag [employer]

## 2011-08-19 ENCOUNTER — Ambulatory Visit (INDEPENDENT_AMBULATORY_CARE_PROVIDER_SITE_OTHER): Payer: BC Managed Care – PPO | Admitting: Obstetrics & Gynecology

## 2011-08-19 ENCOUNTER — Ambulatory Visit (HOSPITAL_COMMUNITY)
Admission: RE | Admit: 2011-08-19 | Discharge: 2011-08-19 | Disposition: A | Discharge status: Home / Self Care | Payer: BC Managed Care – PPO | Source: Ambulatory Visit | Attending: Obstetrics & Gynecology | Admitting: Obstetrics & Gynecology

## 2011-08-19 VITALS — BP 125/74 | Temp 98.6°F | Wt 157.0 lb

## 2011-08-19 DIAGNOSIS — IMO0002 Reserved for concepts with insufficient information to code with codable children: Secondary | ICD-10-CM | POA: Insufficient documentation

## 2011-08-19 DIAGNOSIS — Z3490 Encounter for supervision of normal pregnancy, unspecified, unspecified trimester: Secondary | ICD-10-CM

## 2011-08-19 DIAGNOSIS — Z3480 Encounter for supervision of other normal pregnancy, unspecified trimester: Secondary | ICD-10-CM

## 2011-08-19 DIAGNOSIS — O365990 Maternal care for other known or suspected poor fetal growth, unspecified trimester, not applicable or unspecified: Secondary | ICD-10-CM | POA: Insufficient documentation

## 2011-08-19 DIAGNOSIS — Z3689 Encounter for other specified antenatal screening: Secondary | ICD-10-CM | POA: Insufficient documentation

## 2011-08-19 NOTE — Progress Notes (Signed)
p-81 

## 2011-08-19 NOTE — Progress Notes (Signed)
Pts fundal height still 34 cm.  Will get [place] for growth.  Good fetal mvmt.  No other complaints.  If IUGR or oligo will induce.

## 2011-08-22 ENCOUNTER — Encounter (HOSPITAL_COMMUNITY): Payer: Self-pay | Admitting: Obstetrics and Gynecology

## 2011-08-22 ENCOUNTER — Inpatient Hospital Stay (HOSPITAL_COMMUNITY)
Admission: AD | Admit: 2011-08-22 | Discharge: 2011-08-23 | DRG: 775 | Disposition: A | Discharge status: Home / Self Care | Payer: Self-pay | Source: Ambulatory Visit | Attending: Obstetrics & Gynecology | Admitting: Obstetrics & Gynecology

## 2011-08-22 DIAGNOSIS — IMO0001 Reserved for inherently not codable concepts without codable children: Secondary | ICD-10-CM

## 2011-08-22 DIAGNOSIS — O2200 Varicose veins of lower extremity in pregnancy, unspecified trimester: Secondary | ICD-10-CM

## 2011-08-22 LAB — CBC
HCT: 37.8 % (ref 36.0–46.0)
Hemoglobin: 12.5 g/dL (ref 12.0–15.0)
MCH: 29.1 pg (ref 26.0–34.0)
MCHC: 33.1 g/dL (ref 30.0–36.0)
MCV: 87.9 fL (ref 78.0–100.0)
Platelets: 227 K/uL (ref 150–400)
RBC: 4.3 MIL/uL (ref 3.87–5.11)
RDW: 12.6 % (ref 11.5–15.5)
WBC: 12.7 K/uL — ABNORMAL HIGH (ref 4.0–10.5)

## 2011-08-22 LAB — RPR: RPR Ser Ql: NONREACTIVE

## 2011-08-22 MED ORDER — ONDANSETRON HCL 4 MG/2ML IJ SOLN: 4.0000 mg | INTRAMUSCULAR | Status: DC | PRN

## 2011-08-22 MED ORDER — SENNOSIDES-DOCUSATE SODIUM 8.6-50 MG PO TABS
2.0000 | ORAL_TABLET | Freq: Every day | ORAL | Status: DC
  Administered 2011-08-22: 2 via ORAL

## 2011-08-22 MED ORDER — FLEET ENEMA 7-19 GM/118ML RE ENEM
1.0000 | ENEMA | Freq: Once | RECTAL | Status: AC
  Administered 2011-08-22: 1 via RECTAL

## 2011-08-22 MED ORDER — LIDOCAINE HCL (PF) 1 % IJ SOLN: 30.0000 mL | INTRAMUSCULAR | Status: DC | PRN

## 2011-08-22 MED ORDER — IBUPROFEN 600 MG PO TABS
600.0000 mg | ORAL_TABLET | Freq: Four times a day (QID) | ORAL | Status: DC | PRN
  Administered 2011-08-22: 600 mg via ORAL
  Filled 2011-08-22: qty 1

## 2011-08-22 MED ORDER — OXYCODONE-ACETAMINOPHEN 5-325 MG PO TABS
1.0000 | ORAL_TABLET | ORAL | Status: DC | PRN
  Administered 2011-08-22: 1 via ORAL
  Filled 2011-08-22: qty 1

## 2011-08-22 MED ORDER — LANOLIN HYDROUS EX OINT: TOPICAL_OINTMENT | CUTANEOUS | Status: DC | PRN

## 2011-08-22 MED ORDER — PRENATAL MULTIVITAMIN CH
1.0000 | ORAL_TABLET | Freq: Every day | ORAL | Status: DC
  Administered 2011-08-22 – 2011-08-23 (×2): 1 via ORAL
  Filled 2011-08-22 (×2): qty 1

## 2011-08-22 MED ORDER — OXYTOCIN 20 UNITS IN LACTATED RINGERS INFUSION - SIMPLE: 125.0000 mL/h | Freq: Once | INTRAVENOUS | Status: DC

## 2011-08-22 MED ORDER — IBUPROFEN 600 MG PO TABS
600.0000 mg | ORAL_TABLET | Freq: Four times a day (QID) | ORAL | Status: DC
  Administered 2011-08-23 (×3): 600 mg via ORAL
  Filled 2011-08-22 (×3): qty 1

## 2011-08-22 MED ORDER — BENZOCAINE-MENTHOL 20-0.5 % EX AERO: 1.0000 "application " | INHALATION_SPRAY | CUTANEOUS | Status: DC | PRN

## 2011-08-22 MED ORDER — DIPHENHYDRAMINE HCL 25 MG PO CAPS: 25.0000 mg | ORAL_CAPSULE | Freq: Four times a day (QID) | ORAL | Status: DC | PRN

## 2011-08-22 MED ORDER — OXYTOCIN 10 UNIT/ML IJ SOLN: 10.0000 [IU] | Freq: Once | INTRAMUSCULAR | Status: DC

## 2011-08-22 MED ORDER — FLEET ENEMA 7-19 GM/118ML RE ENEM: 1.0000 | ENEMA | RECTAL | Status: DC | PRN

## 2011-08-22 MED ORDER — ONDANSETRON HCL 4 MG PO TABS: 4.0000 mg | ORAL_TABLET | ORAL | Status: DC | PRN

## 2011-08-22 MED ORDER — ONDANSETRON HCL 4 MG/2ML IJ SOLN: 4.0000 mg | Freq: Four times a day (QID) | INTRAMUSCULAR | Status: DC | PRN

## 2011-08-22 MED ORDER — LACTATED RINGERS IV SOLN: 500.0000 mL | INTRAVENOUS | Status: DC | PRN

## 2011-08-22 MED ORDER — TETANUS-DIPHTH-ACELL PERTUSSIS 5-2.5-18.5 LF-MCG/0.5 IM SUSP: 0.5000 mL | Freq: Once | INTRAMUSCULAR | Status: DC

## 2011-08-22 MED ORDER — OXYCODONE-ACETAMINOPHEN 5-325 MG PO TABS: 1.0000 | ORAL_TABLET | ORAL | Status: DC | PRN

## 2011-08-22 MED ORDER — SIMETHICONE 80 MG PO CHEW: 80.0000 mg | CHEWABLE_TABLET | ORAL | Status: DC | PRN

## 2011-08-22 MED ORDER — DIBUCAINE 1 % RE OINT: 1.0000 "application " | TOPICAL_OINTMENT | RECTAL | Status: DC | PRN

## 2011-08-22 MED ORDER — ZOLPIDEM TARTRATE 5 MG PO TABS: 5.0000 mg | ORAL_TABLET | Freq: Every evening | ORAL | Status: DC | PRN

## 2011-08-22 MED ORDER — WITCH HAZEL-GLYCERIN EX PADS: 1.0000 "application " | MEDICATED_PAD | CUTANEOUS | Status: DC | PRN

## 2011-08-22 MED ORDER — OXYTOCIN BOLUS FROM INFUSION
500.0000 mL | Freq: Once | INTRAVENOUS | Status: DC
  Filled 2011-08-22: qty 500

## 2011-08-22 MED ORDER — CITRIC ACID-SODIUM CITRATE 334-500 MG/5ML PO SOLN: 30.0000 mL | ORAL | Status: DC | PRN

## 2011-08-22 MED ORDER — ACYCLOVIR 400 MG PO TABS
400.0000 mg | ORAL_TABLET | Freq: Two times a day (BID) | ORAL | Status: DC
  Administered 2011-08-22 – 2011-08-23 (×2): 400 mg via ORAL
  Filled 2011-08-22 (×4): qty 1

## 2011-08-22 MED ORDER — ACETAMINOPHEN 325 MG PO TABS: 650.0000 mg | ORAL_TABLET | ORAL | Status: DC | PRN

## 2011-08-22 NOTE — H&P (Signed)
Janice Aguilar is a 31 y.o. female presenting for active labor. Maternal Medical History:  Reason for admission: Reason for admission: contractions.  Contractions: Onset was 6-12 hours ago.   Frequency: regular.   Perceived severity is moderate.    Fetal activity: Perceived fetal activity is normal.   Last perceived fetal movement was within the past hour.    Prenatal complications: no prenatal complications   OB History    Grav Para Term Preterm Abortions TAB SAB Ect Mult Living   3 2 2       2      Past Medical History  Diagnosis Date  . Anemia   . Abnormal Pap smear   . 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 Tonsillectomy    Family History: family history includes Diabetes in her maternal grandmother; Heart disease in her mother; and Hypertension in her father. Social History:  reports that she quit smoking about 6 years ago. She has never used smokeless tobacco. She reports that she does not drink alcohol or use illicit drugs.  Review of Systems  Gastrointestinal: Positive for abdominal pain (contractions).  All other systems reviewed and are negative.    Dilation: 4 Effacement (%): 80 Station: Ballotable;-2 Exam by:: J. Rasch RN  Blood pressure 124/80, pulse 85, temperature 98.2 F (36.8 C), temperature source Oral, resp. rate 18, height 5\' 7"  (1.702 m), weight 71.215 kg (157 lb), last menstrual period 11/26/2010. Maternal Exam:  Uterine Assessment: Contraction strength is moderate.  Contraction frequency is regular.   Abdomen: Estimated fetal weight is 6.5-7.   Fetal presentation: vertex  Introitus: Vagina is positive for vaginal discharge (mucusy).    Fetal Exam Fetal Monitor Review: Baseline rate: 120's.  Variability: moderate (6-25 bpm).   Pattern: accelerations present and early decelerations.    Fetal State Assessment: Category I - tracings are normal.     Physical Exam  Constitutional: She is  oriented to person, place, and time. She appears well-developed and well-nourished. No distress (breathes well during contractions).  HENT:  Head: Normocephalic.  Neck: Normal range of motion. Neck supple.  Cardiovascular: Normal rate, regular rhythm and normal heart sounds.   Respiratory: Effort normal and breath sounds normal.  GI: Soft. There is no tenderness.  Genitourinary: No bleeding around the vagina. Vaginal discharge (mucusy) found.  Musculoskeletal: Normal range of motion.  Neurological: She is alert and oriented to person, place, and time.  Skin: Skin is warm and dry.    Prenatal labs: ABO, Rh: A/NEG/-- (08/17 1116) Antibody: NEG (01/11 1132) Rubella: 30.9 (08/17 1116) RPR: NON REAC (01/11 1013)  HBsAg: NEGATIVE (08/17 1116)  HIV: NON REACTIVE (08/17 1116)  GBS: Negative (03/08 0000)   Assessment/Plan: Active Labor  Plan: Admit to [facility] at bedside Intermittent monitoring Reassess prn   [facility] 08/22/2011, 11:35 AM

## 2011-08-22 NOTE — Progress Notes (Signed)
Pt walking in room--tele monitors tracing maternal at time--provider at bedside and aware

## 2011-08-22 NOTE — MAU Note (Signed)
Pt is a G3P2 at [redacted]w[redacted]d started contracting last night unable to sleep. Pt is here for labor evaluation.

## 2011-08-22 NOTE — Progress Notes (Signed)
Water temp 95--encouraged hot water to be added

## 2011-08-22 NOTE — Progress Notes (Signed)
Continue to have difficulty tracing fetus--pt moves frequently during contractions--will continue to assess

## 2011-08-22 NOTE — Progress Notes (Signed)
Difficulty tracing fetus during contractions--audible decel with contraction--quickly returns to baseline at end of contraction

## 2011-08-22 NOTE — Progress Notes (Signed)
  Subjective: Pt reports increased pain with contractions.  Objective: BP 124/80  Pulse 85  Temp(Src) 98.2 F (36.8 C) (Oral)  Resp 18  Ht 5\' 7"  (1.702 m)  Wt 71.215 kg (157 lb)  BMI 24.59 kg/m2  LMP 11/26/2010      FHT:  FHR: 120's bpm, variability: moderate,  accelerations:  Present,  decelerations:  Present early UC:   regular, every 3-4 minutes SVE:   Dilation: 7 Effacement (%): 90 Station: -1 Exam by:: Jerolyn Center, CNM  Labs: Lab Results  Component Value Date   WBC 12.7* 08/22/2011   HGB 12.5 08/22/2011   HCT 37.8 08/22/2011   MCV 87.9 08/22/2011   PLT 227 08/22/2011    Assessment / Plan: Spontaneous labor, progressing normally  Labor: Progressing normally Preeclampsia:  n/a Fetal Wellbeing:  Category I Pain Control:  Labor support without medications I/D:  n/a Anticipated MOD:  NSVD  [facility] 08/22/2011, 11:56 AM

## 2011-08-22 NOTE — Progress Notes (Signed)
Water temp. 97

## 2011-08-23 MED ORDER — IBUPROFEN 600 MG PO TABS: 600.0000 mg | ORAL_TABLET | Freq: Four times a day (QID) | ORAL | Status: AC

## 2011-08-23 MED ORDER — ACETAMINOPHEN-CODEINE 300-30 MG PO TABS: 1.0000 | ORAL_TABLET | ORAL | Status: AC | PRN

## 2011-08-23 NOTE — Discharge Summary (Signed)
Obstetric Discharge Summary Reason for Admission: onset of labor Prenatal Procedures: ultrasound Intrapartum Procedures: spontaneous vaginal delivery Postpartum Procedures: none Complications-Operative and Postpartum: none Hemoglobin  Date Value Range Status  08/22/2011 12.5  12.0-15.0 (g/dL) Final     HCT  Date Value Range Status  08/22/2011 37.8  36.0-46.0 (%) Final    Physical Exam:  General: alert, cooperative and appears stated age Lochia: appropriate Uterine Fundus: firm Incision: n/a DVT Evaluation: No evidence of DVT seen on physical exam. Negative Homan's sign.  Discharge Diagnoses: Term Pregnancy-delivered  Discharge Information: Date: 08/23/2011 Activity: pelvic rest Diet: routine Medications: Tylenol #3 and Ibuprofen Condition: stable Instructions: refer to practice specific booklet Discharge to: home Follow-up Information    Follow up with [facility] in 6 weeks.   Contact information:   [address] [place] [address]          Newborn Data: Live born female  Birth Weight: 6 lb 11.1 oz (3035 g) APGAR: 9, 9  Home with mother.  [facility] 08/23/2011, 7:28 AM

## 2011-08-23 NOTE — H&P (Signed)
Agree with above note.  Janice Aguilar H. 08/23/2011 7:32 AM

## 2011-08-23 NOTE — Progress Notes (Signed)
Post Partum Day 1 Subjective: up ad lib, voiding and tolerating PO  Objective: Blood pressure 124/86, pulse 76, temperature 98 F (36.7 C), temperature source Oral, resp. rate 18, height 5\' 7"  (1.702 m), weight 71.215 kg (157 lb), last menstrual period 11/26/2010, SpO2 99.00%, unknown if currently breastfeeding.  Physical Exam:  General: alert, cooperative and appears stated age Lochia: appropriate Uterine Fundus: firm Incision: n/a DVT Evaluation: No evidence of DVT seen on physical exam. Negative Homan's sign.   Basename 08/22/11 1001  HGB 12.5  HCT 37.8    Assessment/Plan: Discharge home and Contraception husband plans vasectomy.   LOS: 1 day   [facility] 08/23/2011, 7:31 AM

## 2011-08-24 NOTE — Discharge Summary (Signed)
Agree with above note.  Janice Aguilar H. 08/24/2011 3:18 PM

## 2011-08-24 NOTE — Progress Notes (Signed)
Post discharge chart review completed.  

## 2011-08-26 ENCOUNTER — Encounter: Payer: BC Managed Care – PPO | Admitting: Obstetrics & Gynecology

## 2011-09-25 ENCOUNTER — Ambulatory Visit (INDEPENDENT_AMBULATORY_CARE_PROVIDER_SITE_OTHER): Payer: BC Managed Care – PPO | Admitting: Family

## 2011-09-25 ENCOUNTER — Encounter: Payer: Self-pay | Admitting: Family

## 2011-09-25 NOTE — Progress Notes (Signed)
  Subjective:     Janice Aguilar is a 31 y.o. female who presents for a postpartum visit. She is 5 weeks postpartum following a spontaneous vaginal delivery. I have fully reviewed the prenatal and intrapartum course. The delivery was at 38 gestational weeks. Outcome: spontaneous vaginal delivery. Anesthesia: none. Postpartum course has been unremarkable. Baby's course has been marked by reflux which results in continuous crying. Baby is feeding by breast. Bleeding staining only. Bowel function is normal. Bladder function is normal. Patient is not sexually active. Contraception method is plans vasectomy. Postpartum depression screening: negative.  Pt dealing with stress by making sure she goes out to mother groups and having other time alone.  Reports varicose veins has resolved.  The following portions of the patient's history were reviewed and updated as appropriate: allergies, current medications, past family history, past medical history, past social history, past surgical history and problem list.  Review of Systems Pertinent items are noted in HPI.   Objective:    BP 135/78  Pulse 80  Temp(Src) 98.6 F (37 C) (Oral)  Resp 16  Ht 5\' 6"  (1.676 m)  Wt 146 lb (66.225 kg)  BMI 23.56 kg/m2  Breastfeeding? Yes  General:  alert, cooperative and appears stated age   Breasts:  inspection negative, no nipple discharge or bleeding, no masses or nodularity palpable  Lungs: clear to auscultation bilaterally  Heart:  regular rate and rhythm, S1, S2 normal, no murmur, click, rub or gallop  Abdomen: soft, non-tender; bowel sounds normal; no masses,  no organomegaly   Vulva:  not evaluated  Vagina: not evaluated  Cervix:  Not evaluated  Corpus: not examined  Adnexa:  normal adnexa  Rectal Exam: Normal rectovaginal exam        Assessment:    Normal postpartum exam. Pap smear not done at today's visit.   Plan:    1. Contraception: none 2. 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 Follow up in: as soon as  possible for colposcopy

## 2011-10-14 ENCOUNTER — Ambulatory Visit (INDEPENDENT_AMBULATORY_CARE_PROVIDER_SITE_OTHER): Payer: BC Managed Care – PPO | Admitting: Obstetrics & Gynecology

## 2011-10-14 ENCOUNTER — Encounter: Payer: Self-pay | Admitting: Obstetrics & Gynecology

## 2011-10-14 VITALS — BP 115/69 | HR 76 | Temp 98.4°F | Resp 16 | Ht 65.0 in | Wt 141.0 lb

## 2011-10-14 DIAGNOSIS — N911 Secondary amenorrhea: Secondary | ICD-10-CM

## 2011-10-14 DIAGNOSIS — Z01812 Encounter for preprocedural laboratory examination: Secondary | ICD-10-CM

## 2011-10-14 DIAGNOSIS — IMO0002 Reserved for concepts with insufficient information to code with codable children: Secondary | ICD-10-CM

## 2011-10-14 DIAGNOSIS — R87612 Low grade squamous intraepithelial lesion on cytologic smear of cervix (LGSIL): Secondary | ICD-10-CM

## 2011-10-14 NOTE — Patient Instructions (Signed)
Colposcopy Colposcopy is a procedure that uses a special lighted microscope (colposcope). It examines your cervix and vagina, or the area around the outside of the vagina, for signs of disease or abnormalities in the cells. You may be sent to a specialist (gynecologist) to do the colposcopy. A biopsy (tissue sample) may be collected during a colposcopy, if the caregiver finds any unusual cells. The biopsy is sent to the lab for further testing, and the results are reported back to your caregiver. A WOMAN MAY NEED THIS PROCEDURE IF:  She has had an abnormal pap smear (taking cells from the cervix for testing).   She has a sore on her cervix, and a Pap test was normal.   The Pap test suggests human papilloma virus (HPV). This virus can cause genital warts and is linked to the development of cervical cancer.   She has genital warts on the cervix, or in or around the outside of the vagina.   Her mother took the drug DES while pregnant.   She has painful intercourse.   She has vaginal bleeding, especially after sexual intercourse.   There is a need to evaluate the results of previous treatment.  BEFORE THE PROCEDURE   Colposcopy is done when you are not having a menstrual period.   For 24 hours before the colposcopy, do not:   Douche.   Use tampons.   Use medicines, creams, or suppositories in the vagina.   Have sexual intercourse.  PROCEDURE   A colposcopy is done while a woman is lying on her back with her feet in foot rests (stirrups).   A speculum is placed inside the vagina to keep it open and to allow the caregiver to see the cervix. This is the same instrument used to do a pap smear.   The colposcope is placed outside the vagina. It is used to magnify and examine the cervix, vagina, and the area around the outside of the vagina.   A small amount of liquid solution is placed on the area that is to be viewed. This solution is placed on with a cotton applicator. This solution  makes it easier to see the abnormal cells.   Your caregiver will suck out mucus and cells from the canal of the cervix.   Small pieces of tissue for biopsy may be taken at the same time. You may feel mild pain or discomfort when this is done.   Your caregiver will record the location of the abnormal areas and send the tissue samples to a lab for analysis.   If your caregiver biopsies the vagina or outside of the vagina, a local anesthetic (novocaine) is usually given.  AFTER THE PROCEDURE   You may have some cramping that often goes away in a few minutes. You may have some soreness for a couple of days.   You may take over-the-counter pain medicine as advised by your caregiver. Do not take aspirin because it can cause bleeding.   Lie down for a few minutes if you feel lightheaded.   You may have some bleeding or dark discharge that should stop in a few days.   You may need to wear a sanitary pad for a few days.  HOME CARE INSTRUCTIONS   Avoid sex, douching, and using tampons for a week or as directed.   Only take medicine as directed by your caregiver.   Continue to take birth control pills, if you are on them.   Not all test results are   available during your visit. If your test results are not back during the visit, make an appointment with your caregiver to find out the results. Do not assume everything is normal if you have not heard from your caregiver or the medical facility. It is important for you to follow up on all of your test results.   Follow your caregiver's advice regarding medicines, activity, follow-up visits, and follow-up Pap tests.  SEEK MEDICAL CARE IF:   You develop a rash.   You have problems with your medicine.  SEEK IMMEDIATE MEDICAL CARE IF:  You are bleeding heavily or are passing blood clots.   You develop a fever over 102 F (38.9 C), with or without chills.   You have abnormal vaginal discharge.   You are having cramps that do not go away  after taking your pain medicine.   You feel lightheaded, dizzy, or faint.   You develop stomach pain.  Document Released: 08/08/2002 Document Revised: 05/07/2011 Document Reviewed: 03/21/2009 ExitCare Patient Information 2012 [employer]. 

## 2011-10-14 NOTE — Progress Notes (Signed)
Addended by: Granville Lewis on: 10/14/2011 10:44 AM   Modules accepted: Orders

## 2011-10-14 NOTE — Progress Notes (Signed)
Patient ID: Janice Aguilar, female   DOB: [birth year], 31 y.o.   MRN: [id number]  Chief Complaint  Patient presents with  . Colposcopy    LGSIL 8/12    HPI ANAHITA CUA is a 31 y.o. female.  Breast feeding, 7 weeks post partum HPI  Indications: Pap smear on August 2012 showed: low-grade squamous intraepithelial neoplasia (LGSIL - encompassing HPV,mild dysplasia,CIN I). 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 Prior cervical treatment: no treatment.  Past Medical History  Diagnosis Date  . Anemia   . Abnormal Pap smear   . Varicose veins of legs, antepartum 06/12/2011    Past Surgical History  Procedure Date  . Tonsillectomy     Family History  Problem Relation Age of Onset  . Hypertension Father   . Heart disease Mother   . Diabetes Maternal Grandmother     Social History History  Substance Use Topics  . Smoking status: Former Smoker -- 0.5 packs/day for 10 years    Quit date: 01/15/2005  . Smokeless tobacco: Never Used  . Alcohol Use: No    No Known Allergies  Current Outpatient Prescriptions  Medication Sig Dispense Refill  . acetaminophen-codeine (TYLENOL #3) 300-30 MG per tablet       . acyclovir (ZOVIRAX) 400 MG tablet Take 1 tablet (400 mg total) by mouth 2 (two) times daily.  60 tablet  7  . calcium-vitamin D (OSCAL WITH D) 250-125 MG-UNIT per tablet Take 1 tablet by mouth daily.      . calcium-vitamin D (OSCAL WITH D) 500-200 MG-UNIT per tablet Take 1 tablet by mouth daily.        Marland Kitchen ibuprofen (ADVIL,MOTRIN) 600 MG tablet       . Prenatal Vit-Fe Sulfate-FA (PRENATAL MULTIVIT-IRON PO) Take 1 tablet by mouth daily.        Marland Kitchen DISCONTD: doxylamine, Sleep, (UNISOM) 25 MG tablet Take 25 mg by mouth at bedtime as needed.          Review of Systems Review of Systems  Blood pressure 115/69, pulse 76, temperature 98.4 F (36.9 C), temperature source Oral, resp. rate 16, height 5\' 5"  (1.651 m), weight 63.957 kg (141 lb), currently  breastfeeding.  Physical Exam Physical Exam  Genitourinary:      Data Reviewed Pap smear and prior colpo report reviewed  Assessment    Procedure Details  The risks and benefits of the procedure and Written informed consent obtained.  Speculum placed in vagina and excellent visualization of cervix achieved, cervix swabbed x 3 with acetic acid solution.  Specimens: biopsy at 10 o'clock, 6 o'clock and 7 o'clock  Complications: none.     Plan    Specimens labelled and sent to Pathology. Will call patient with results if benign or CIN I.  If CIN 2 or greater--will need excisional procedure.      Jerzy Roepke H. 10/14/2011, 9:38 AM

## 2011-10-19 ENCOUNTER — Telehealth: Payer: Self-pay | Admitting: *Deleted

## 2011-10-19 NOTE — Telephone Encounter (Signed)
Pt notified via phone that  Her biopsies from her Colposcopy were neg and will repeat pap in 6 months.  Pt's infant son is in [facility] due to failure to thrive.  Running a battery of tests and hope to be d/c on Wed.

## 2011-10-22 ENCOUNTER — Encounter: Payer: Self-pay | Admitting: *Deleted

## 2012-02-29 ENCOUNTER — Ambulatory Visit (INDEPENDENT_AMBULATORY_CARE_PROVIDER_SITE_OTHER): Payer: BC Managed Care – PPO | Admitting: *Deleted

## 2012-02-29 VITALS — BP 113/77 | HR 70 | Temp 98.5°F | Resp 16 | Wt 146.0 lb

## 2012-02-29 DIAGNOSIS — Z23 Encounter for immunization: Secondary | ICD-10-CM

## 2012-02-29 MED ORDER — INFLUENZA VIRUS VACC SPLIT PF IM SUSP: 0.5000 mL | INTRAMUSCULAR | Status: DC

## 2012-03-10 ENCOUNTER — Ambulatory Visit (INDEPENDENT_AMBULATORY_CARE_PROVIDER_SITE_OTHER): Payer: BC Managed Care – PPO | Admitting: Family Medicine

## 2012-03-10 ENCOUNTER — Encounter: Payer: Self-pay | Admitting: Family Medicine

## 2012-03-10 VITALS — BP 121/80 | HR 72 | Ht 66.5 in | Wt 150.0 lb

## 2012-03-10 DIAGNOSIS — M5430 Sciatica, unspecified side: Secondary | ICD-10-CM

## 2012-03-10 DIAGNOSIS — M533 Sacrococcygeal disorders, not elsewhere classified: Secondary | ICD-10-CM

## 2012-03-10 MED ORDER — PREDNISONE 20 MG PO TABS: 40.0000 mg | ORAL_TABLET | Freq: Every day | ORAL | Status: DC

## 2012-03-10 MED ORDER — MELOXICAM 15 MG PO TABS: 15.0000 mg | ORAL_TABLET | Freq: Every day | ORAL | Status: DC

## 2012-03-10 MED ORDER — CYCLOBENZAPRINE HCL 10 MG PO TABS: 5.0000 mg | ORAL_TABLET | Freq: Every evening | ORAL | Status: DC | PRN

## 2012-03-10 NOTE — Patient Instructions (Signed)
Sciatica Sciatica is pain, weakness, numbness, or tingling along the path of the sciatic nerve. The nerve starts in the lower back and runs down the back of each leg. The nerve controls the muscles in the lower leg and in the back of the knee, while also providing sensation to the back of the thigh, lower leg, and the sole of your foot. Sciatica is a symptom of another medical condition. For instance, nerve damage or certain conditions, such as a herniated disk or bone spur on the spine, pinch or put pressure on the sciatic nerve. This causes the pain, weakness, or other sensations normally associated with sciatica. Generally, sciatica only affects one side of the body. CAUSES   Herniated or slipped disc.  Degenerative disk disease.  A pain disorder involving the narrow muscle in the buttocks (piriformis syndrome).  Pelvic injury or fracture.  Pregnancy.  Tumor (rare). SYMPTOMS  Symptoms can vary from mild to very severe. The symptoms usually travel from the low back to the buttocks and down the back of the leg. Symptoms can include:  Mild tingling or dull aches in the lower back, leg, or hip.  Numbness in the back of the calf or sole of the foot.  Burning sensations in the lower back, leg, or hip.  Sharp pains in the lower back, leg, or hip.  Leg weakness.  Severe back pain inhibiting movement. These symptoms may get worse with coughing, sneezing, laughing, or prolonged sitting or standing. Also, being overweight may worsen symptoms. DIAGNOSIS  Your caregiver will perform a physical exam to look for common symptoms of sciatica. He or she may ask you to do certain movements or activities that would trigger sciatic nerve pain. Other tests may be performed to find the cause of the sciatica. These may include:  Blood tests.  X-rays.  Imaging tests, such as an MRI or CT scan. TREATMENT  Treatment is directed at the cause of the sciatic pain. Sometimes, treatment is not necessary  and the pain and discomfort goes away on its own. If treatment is needed, your caregiver may suggest:  Over-the-counter medicines to relieve pain.  Prescription medicines, such as anti-inflammatory medicine, muscle relaxants, or narcotics.  Applying heat or ice to the painful area.  Steroid injections to lessen pain, irritation, and inflammation around the nerve.  Reducing activity during periods of pain.  Exercising and stretching to strengthen your abdomen and improve flexibility of your spine. Your caregiver may suggest losing weight if the extra weight makes the back pain worse.  Physical therapy.  Surgery to eliminate what is pressing or pinching the nerve, such as a bone spur or part of a herniated disk. HOME CARE INSTRUCTIONS   Only take over-the-counter or prescription medicines for pain or discomfort as directed by your caregiver.  Apply ice to the affected area for 20 minutes, 3 4 times a day for the first 48 72 hours. Then try heat in the same way.  Exercise, stretch, or perform your usual activities if these do not aggravate your pain.  Attend physical therapy sessions as directed by your caregiver.  Keep all follow-up appointments as directed by your caregiver.  Do not wear high heels or shoes that do not provide proper support.  Check your mattress to see if it is too soft. A firm mattress may lessen your pain and discomfort. SEEK IMMEDIATE MEDICAL CARE IF:   You lose control of your bowel or bladder (incontinence).  You have increasing weakness in the lower back,   pelvis, buttocks, or legs.  You have redness or swelling of your back.  You have a burning sensation when you urinate.  You have pain that gets worse when you lie down or awakens you at night.  Your pain is worse than you have experienced in the past.  Your pain is lasting longer than 4 weeks.  You are suddenly losing weight without reason. MAKE SURE YOU:  Understand these  instructions.  Will watch your condition.  Will get help right away if you are not doing well or get worse. Document Released: 05/12/2001 Document Revised: 11/17/2011 Document Reviewed: 09/27/2011 Exit[employer] Patient Information 2013 [employer].  

## 2012-03-10 NOTE — Progress Notes (Signed)
  Subjective:    Patient ID: Janice Aguilar, female    DOB: 1980/09/03, 31 y.o.   MRN: [id number]  HPI Left lower back pain - started one week ago.  Says got worse yesterday. No acute injury. Will occ shoot and burn down into her left buttock.  Worse if extends.  Better with flexion.  No pain over the actual lumbar spine.  Taking some IBU as needed - Helps some. DId a heating pad too.     Review of Systems     Objective:   Physical Exam  Constitutional: She appears well-developed and well-nourished.  Musculoskeletal:       Normal  Flexion, dec extension, Normal side bending and rotation.  Tender over the Left SI joint. nontender over the spine or buttock.  Neg straight leg raise. Hip, knee, ankle strength is 5/5 bilaterally.   Skin: Skin is warm and dry.  Psychiatric: She has a normal mood and affect. Her behavior is normal.          Assessment & Plan:  Left sciatica - Will tx with 5 days of steroids and then can start the mobic. Can also use Tyelnol for pain.  Will given flexeril for bedtime.  H.O given for exercise.  Dicussed dx, suspect disc herniation.  F/u in 2-3 weeks if not at least 50% improved.    SI joint tenderness - May also have some component of SI joint inflammation. Mobic should help.

## 2012-03-14 ENCOUNTER — Telehealth: Payer: Self-pay | Admitting: Family Medicine

## 2012-03-14 NOTE — Telephone Encounter (Signed)
Call pt: Let her know I checked,  and Olegario Messier, counselor downstairs, does to family counseling. She is not specifically trained for marital counseling but I think she is good.

## 2012-03-14 NOTE — Telephone Encounter (Signed)
Patient notified

## 2012-04-08 ENCOUNTER — Encounter: Payer: Self-pay | Admitting: Family

## 2012-04-08 ENCOUNTER — Ambulatory Visit (INDEPENDENT_AMBULATORY_CARE_PROVIDER_SITE_OTHER): Payer: BC Managed Care – PPO | Admitting: Family

## 2012-04-08 VITALS — BP 128/82 | HR 101 | Temp 98.3°F | Resp 16 | Ht 65.0 in | Wt 151.0 lb

## 2012-04-08 DIAGNOSIS — Z1151 Encounter for screening for human papillomavirus (HPV): Secondary | ICD-10-CM

## 2012-04-08 DIAGNOSIS — Z01419 Encounter for gynecological examination (general) (routine) without abnormal findings: Secondary | ICD-10-CM

## 2012-04-08 DIAGNOSIS — Z124 Encounter for screening for malignant neoplasm of cervix: Secondary | ICD-10-CM

## 2012-04-08 NOTE — Progress Notes (Signed)
  Subjective:     Janice Aguilar is a 31 y.o. female here for a routine exam.  Current complaints: back pain that is being followed by PCP.  Personal health questionnaire reviewed: yes.   Gynecologic History No LMP recorded. Patient is not currently having periods (Reason: Other). Contraception: vasectomy Last Pap: CIN I. Results were: abnormal; colpo - negative Last mammogram: n/a.   Obstetric History OB History    Grav Para Term Preterm Abortions TAB SAB Ect Mult Living   3 3 3       3      # Outc Date GA Lbr Len/2nd Wgt Sex Del Anes PTL Lv   1 TRM 10/01 [redacted]w[redacted]d  7lb10.5oz(3.473kg) F SVD EPI No Yes   2 TRM 5/10 [redacted]w[redacted]d  8lb6oz(3.799kg) M SVD None No Yes   3 TRM 3/13 110w3d 00:00 6lb11.1oz(3.035kg) M SVD None  Yes       The following portions of the patient's history were reviewed and updated as appropriate: allergies, current medications, past family history, past medical history, past social history, past surgical history and problem list.  Review of Systems Pertinent items are noted in HPI.    Objective:    BP 128/82  Pulse 101  Temp 98.3 F (36.8 C) (Oral)  Resp 16  Ht 5\' 5"  (1.651 m)  Wt 151 lb (68.493 kg)  BMI 25.13 kg/m2  Breastfeeding? No  General Appearance:    Alert, cooperative, no distress, appears stated age  Head:    Normocephalic, without obvious abnormality, atraumatic  Eyes:    PERRL, conjunctiva/corneas clear, EOM's intact, fundi    benign, both eyes  Ears:    Normal TM's and external ear canals, both ears  Nose:   Nares normal, septum midline, mucosa normal, no drainage    or sinus tenderness  Throat:   Lips, mucosa, and tongue normal; teeth and gums normal  Neck:   Supple, symmetrical, trachea midline, no adenopathy;    thyroid:  no enlargement/tenderness/nodules; no carotid   bruit or JVD  Back:     Symmetric, no curvature, ROM normal, no CVA tenderness  Lungs:     Clear to auscultation bilaterally, respirations unlabored  Chest Wall:    No  tenderness or deformity   Heart:    Regular rate and rhythm, S1 and S2 normal, no murmur, rub   or gallop  Breast Exam:    No tenderness, masses, or nipple abnormality  Abdomen:     Soft, non-tender, bowel sounds active all four quadrants,    no masses, no organomegaly  Genitalia:    Normal female without lesion, discharge or tenderness     Extremities:   Extremities normal, atraumatic, no cyanosis or edema  Pulses:   2+ and symmetric all extremities  Skin:   Skin color, texture, turgor normal, no rashes or lesions  Lymph nodes:   Cervical, supraclavicular, and axillary nodes normal  Neurologic:   CNII-XII intact, normal strength, sensation and reflexes    throughout      Assessment:    Healthy female exam.   Hx of Abnormal Pap Smear Plan:    Contraception: vasectomy.   Pap smear to lab - await results to determine follow-up needed.

## 2012-04-10 ENCOUNTER — Other Ambulatory Visit: Payer: Self-pay | Admitting: Obstetrics and Gynecology

## 2012-04-10 NOTE — Telephone Encounter (Signed)
Please refill acyclovir 400 disp 30 with 5 refill

## 2012-04-19 ENCOUNTER — Telehealth: Payer: Self-pay | Admitting: Family Medicine

## 2012-04-19 DIAGNOSIS — F39 Unspecified mood [affective] disorder: Secondary | ICD-10-CM

## 2012-04-19 NOTE — Telephone Encounter (Signed)
Referral done and patient notified.

## 2012-04-19 NOTE — Telephone Encounter (Signed)
Pt would like a ref for behavioral health pls.  Can you put it in the system for her?  Thanks.

## 2012-07-06 ENCOUNTER — Encounter: Payer: Self-pay | Admitting: *Deleted

## 2012-07-06 ENCOUNTER — Emergency Department (INDEPENDENT_AMBULATORY_CARE_PROVIDER_SITE_OTHER)
Admission: EM | Admit: 2012-07-06 | Discharge: 2012-07-06 | Disposition: A | Discharge status: Home / Self Care | Payer: BC Managed Care – PPO | Source: Home / Self Care | Attending: Family Medicine | Admitting: Family Medicine

## 2012-07-06 DIAGNOSIS — S0181XA Laceration without foreign body of other part of head, initial encounter: Secondary | ICD-10-CM

## 2012-07-06 DIAGNOSIS — S0180XA Unspecified open wound of other part of head, initial encounter: Secondary | ICD-10-CM

## 2012-07-06 DIAGNOSIS — Z23 Encounter for immunization: Secondary | ICD-10-CM

## 2012-07-06 MED ORDER — CEPHALEXIN 500 MG PO CAPS: 500.0000 mg | ORAL_CAPSULE | Freq: Two times a day (BID) | ORAL | Status: DC

## 2012-07-06 MED ORDER — TETANUS-DIPHTH-ACELL PERTUSSIS 5-2.5-18.5 LF-MCG/0.5 IM SUSP
0.5000 mL | Freq: Once | INTRAMUSCULAR | Status: AC
  Administered 2012-07-06: 0.5 mL via INTRAMUSCULAR

## 2012-07-06 NOTE — ED Provider Notes (Signed)
History     CSN: [id number]  Arrival date & time 07/06/12  1605   First MD Initiated Contact with Patient 07/06/12 1657      Chief Complaint  Patient presents with  . Facial Laceration    chin      HPI Comments: Janice Aguilar reports tripping over a gas hose today at 1pm while @ [employer], hitting the pavement with her chin resulting in a laceration.  She has cleaned the site with peroxide.  No loss of consciousness.  No mouth injury.  No jaw pain.  She is not sure about her last Tdap.  Patient is a 32 y.o. female presenting with skin laceration. The history is provided by the patient.  Laceration  The incident occurred 3 to 5 hours ago. Pain location: chin. The laceration is 1 cm in size. Injury mechanism: a fall. The pain is mild. She reports no foreign bodies present. Her tetanus status is out of date.    Past Medical History  Diagnosis Date  . Anemia   . Abnormal Pap smear   . 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 Herniated disc     Past Surgical History  Procedure Date  . Tonsillectomy     Family History  Problem Relation Age of Onset  . Hypertension Father   . Heart disease Mother   . Diabetes Maternal Grandmother     History  Substance Use Topics  . Smoking status: Former Smoker -- 0.5 packs/day for 10 years    Quit date: 01/15/2005  . Smokeless tobacco: Never Used  . Alcohol Use: No    OB History    Grav Para Term Preterm Abortions TAB SAB Ect Mult Living   3 3 3       3       Review of Systems  All other systems reviewed and are negative.    Allergies  Review of patient's allergies indicates no known allergies.  Home Medications   Current Outpatient Rx  Name  Route  Sig  Dispense  Refill  . ACYCLOVIR 200 MG PO CAPS      TAKE ONE CAPSULE BY MOUTH EVERY 4 HOURS FOR 5 DAYS FOR OUTBREAK   30 capsule   6   . ACYCLOVIR 400 MG PO TABS      TAKE 1 TABLET (400 MG TOTAL) BY MOUTH 2 (TWO) TIMES DAILY.   60 tablet   7   . CEPHALEXIN 500 MG  PO CAPS   Oral   Take 1 capsule (500 mg total) by mouth 2 (two) times daily.   14 capsule   0   . CYCLOBENZAPRINE HCL 10 MG PO TABS   Oral   Take 0.5-1 tablets (5-10 mg total) by mouth at bedtime as needed for muscle spasms.   30 tablet   0     BP 123/88  Pulse 88  Temp 98.7 F (37.1 C) (Oral)  Resp 14  Ht 5\' 7"  (1.702 m)  Wt 154 lb (69.854 kg)  BMI 24.12 kg/m2  SpO2 98%  Breastfeeding? No  Physical Exam  Nursing note and vitals reviewed. Constitutional: She is oriented to person, place, and time. She appears well-developed and well-nourished. No distress.  HENT:  Head: Normocephalic. Head is with laceration. Head is without abrasion and without contusion.    Nose: Nose normal.  Mouth/Throat: Oropharynx is clear and moist.       There is a one cm long simple linear superficial laceration on chin as noted on  diagram.  No swelling.  Minimal tenderness  Eyes: Conjunctivae normal and EOM are normal. Pupils are equal, round, and reactive to light.  Neurological: She is alert and oriented to person, place, and time.  Skin: Skin is warm and dry.    ED Course  Procedures   Laceration Repair Discussed benefits and risks of procedure and verbal consent obtained. Using sterile technique, cleansed wound with normal saline.  Wound carefully inspected for debris and foreign bodies; none found.  Wound closed with four 2mm wide butterfly bandages, followed by application of Dermabond.  Wound precautions explained to patient.       1. Laceration of chin       MDM  Tdap given Begin empiric Keflex for one week.  Keep wound clean and dry.  Follow Dermabond instructions.  Return for any signs of infection:  Increasing redness, swelling, drainage, etc.        Lattie Haw, MD 07/06/12 [phone]

## 2012-07-06 NOTE — ED Notes (Signed)
Jonai reports tripping over a gas hose today @ 1pm while @ [employer], hitting the pavement with her chin. She has cleaned the site with peroxide.

## 2012-10-07 ENCOUNTER — Encounter: Payer: Self-pay | Admitting: Obstetrics and Gynecology

## 2012-10-07 ENCOUNTER — Ambulatory Visit (INDEPENDENT_AMBULATORY_CARE_PROVIDER_SITE_OTHER): Payer: BC Managed Care – PPO | Admitting: Obstetrics and Gynecology

## 2012-10-07 VITALS — BP 126/87 | HR 79 | Resp 16 | Ht 67.0 in | Wt 141.0 lb

## 2012-10-07 DIAGNOSIS — R87612 Low grade squamous intraepithelial lesion on cytologic smear of cervix (LGSIL): Secondary | ICD-10-CM

## 2012-10-07 DIAGNOSIS — IMO0002 Reserved for concepts with insufficient information to code with codable children: Secondary | ICD-10-CM

## 2012-10-07 DIAGNOSIS — Z124 Encounter for screening for malignant neoplasm of cervix: Secondary | ICD-10-CM

## 2012-10-07 DIAGNOSIS — R6889 Other general symptoms and signs: Secondary | ICD-10-CM

## 2012-10-07 DIAGNOSIS — N920 Excessive and frequent menstruation with regular cycle: Secondary | ICD-10-CM

## 2012-10-07 DIAGNOSIS — Z1151 Encounter for screening for human papillomavirus (HPV): Secondary | ICD-10-CM

## 2012-10-07 DIAGNOSIS — R87619 Unspecified abnormal cytological findings in specimens from cervix uteri: Secondary | ICD-10-CM

## 2012-10-07 MED ORDER — TRANEXAMIC ACID 650 MG PO TABS: 1300.0000 mg | ORAL_TABLET | Freq: Three times a day (TID) | ORAL | Status: DC

## 2012-10-07 NOTE — Patient Instructions (Signed)
Abnormal Pap Test Information  During a Pap test, the cells on the surface of your cervix are checked to see if they look normal, abnormal, or if they show signs of having been altered by a certain type of virus called human papillomavirus, or HPV. Cervical cells that have been affected by HPV are called dysplasia. Dysplasia is not cancer, but describes abnormal cells found on the surface of the cervix. Depending on the degree of dysplasia, some of the cells may be considered pre-cancerous and may turn into cancer over time if follow up with a caregiver is delayed.   WHAT DOES AN ABNORMAL PAP TEST MEAN?  Having an abnormal pap test does not mean that you have cancer. However, certain types of abnormal pap tests can be a sign that a person is at a higher risk of developing cancer. Your caregiver will want to do other tests to find out more about the abnormal cells. Your abnormal Pap test results could show:   · Small and uncertain changes that should be carefully watched.    · Cervical dysplasia that has caused mild changes and can be followed over time.    · Cervical dysplasia that is more severe and needs to be followed and treated to ensure the problem goes away.  · Cancer.    When severe cervical dysplasia is found and treated early, it rarely will grow into cancer.   WHAT WILL BE DONE ABOUT MY ABNORMAL PAP TEST?  · A colposcopy may be needed. This is a procedure where your cervix is examined using light and magnification.  · A small tissue sample of your cervix (biopsy) may need to be removed and then examined. This is often performed if there are areas that appear infected.  · A sample of cells from the cervical canal may be removed with either a small brush or scraping instrument (curette).  Based on the results of the procedures above, some caregivers may recommend either cryotherapy of the cervix or a surgical LEEP where a portion of the cervix is removed. LEEP is short for "loop electrical excisional  procedure." Rarely, a caregiver may recommend a cone biopsy. This is a procedure where a small, cone-shaped sample of your cervix is taken out. The part that is taken out is the area where the abnormal cells are.    WHAT IF I HAVE A DYSPLASIA OR A CANCER?  You may be referred to a specialist. Radiation may also be a treatment for more advanced cancer. Having a hysterectomy is the last treatment option for dysplasia, but it is a more common treatment for someone with cancer. All treatment options will be discussed with you by your caregiver.  WHAT SHOULD YOU DO AFTER BEING TREATED?  If you have had an abnormal pap test, you should continue to have regular pap tests and check-ups as directed by your caregiver. Your cervical problem will be carefully watched so it does not get worse. Also, your caregiver can watch for, and treat, any new problems that may come up.  Document Released: 09/02/2010 Document Revised: 08/10/2011 Document Reviewed: 05/14/2011  [employer]® Patient Information ©2013 [employer].

## 2012-10-07 NOTE — Progress Notes (Signed)
CC: Abnormal Pap Smear   HPI Janice Aguilar is a 32 y.o. [phone] for Pap. Had LGSIL 09/2011 and negative pap11/2013.  LMP 09/20/12. Denies abnormal bleeding. Concerned with heavy flow first 2 days of periods and requests trianexamic acid rx.  Contraception: Husband had vasectomy. Janice Aguilar is 14 months. Plants return to work this Fall.   Past Medical History  Diagnosis Date  . Anemia   . Abnormal Pap smear   . 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 Herniated disc     OB History   Grav Para Term Preterm Abortions TAB SAB Ect Mult Living   3 3 3       3      # Outc Date GA Lbr Len/2nd Wgt Sex Del Anes PTL Lv   1 TRM 10/01 [redacted]w[redacted]d  7lb10.5oz(3.473kg) F SVD EPI No Yes   2 TRM 5/10 [redacted]w[redacted]d  8lb6oz(3.799kg) M SVD None No Yes   3 TRM 3/13 [redacted]w[redacted]d 00:00 6lb11.1oz(3.035kg) M SVD None  Yes      Past Surgical History  Procedure Laterality Date  . Tonsillectomy      History   Social History  . Marital Status: Married    Spouse Name: N/A    Number of Children: N/A  . Years of Education: N/A   Occupational History  . Not on file.   Social History Main Topics  . Smoking status: Former Smoker -- 0.50 packs/day for 10 years    Quit date: 01/15/2005  . Smokeless tobacco: Never Used  . Alcohol Use: No  . Drug Use: No  . Sexually Active: Yes   Other Topics Concern  . Not on file   Social History Narrative  . No narrative on file    Current Outpatient Prescriptions on File Prior to Visit  Medication Sig Dispense Refill  . acyclovir (ZOVIRAX) 200 MG capsule TAKE ONE CAPSULE BY MOUTH EVERY 4 HOURS FOR 5 DAYS FOR OUTBREAK  30 capsule  6  . acyclovir (ZOVIRAX) 400 MG tablet TAKE 1 TABLET (400 MG TOTAL) BY MOUTH 2 (TWO) TIMES DAILY.  60 tablet  7  . [DISCONTINUED] doxylamine, Sleep, (UNISOM) 25 MG tablet Take 25 mg by mouth at bedtime as needed.         No current facility-administered medications on file prior to visit.    No Known Allergies  ROS Pertinent items in  HPI  PHYSICAL EXAM Filed Vitals:   10/07/12 0923  BP: 126/87  Pulse: 79  Resp: 16   General: Well nourished, well developed female in no acute distress Cardiovascular: Normal rate Respiratory: Normal effort Abdomen: Soft, nontender Back: No CVAT Extremities: No edema Neurologic: Alert and oriented Speculum exam: NEFG; vagina with physiologic discharge, no blood; cervix clean Bimanual exam: cervix closed, no CMT; uterus NSSP; no adnexal tenderness or masses  ASSESSMENT  1. Abnormal Pap smear of cervix   2. LGSIL (low grade squamous intraepithelial lesion) on Pap smear   3. Menorrhagia     PLAN Pap sent  F/U pending result of Pap See AVS for patient education.    Medication List       These changes are accurate as of: 10/07/2012 10:02 AM. If you have any questions, ask your nurse or doctor.          STOP taking these medications       cephALEXin 500 MG capsule  Commonly known as:  KEFLEX  Stopped by:  Danae Orleans, CNM     cyclobenzaprine 10 MG  tablet  Commonly known as:  FLEXERIL  Stopped by:  Danae Orleans, CNM      TAKE these medications       acyclovir 400 MG tablet  Commonly known as:  ZOVIRAX  TAKE 1 TABLET (400 MG TOTAL) BY MOUTH 2 (TWO) TIMES DAILY.     acyclovir 200 MG capsule  Commonly known as:  ZOVIRAX  TAKE ONE CAPSULE BY MOUTH EVERY 4 HOURS FOR 5 DAYS FOR OUTBREAK     tranexamic acid 650 MG Tabs  Commonly known as:  LYSTEDA  Take 2 tablets (1,300 mg total) by mouth 3 (three) times daily.  Started by:  Danae Orleans, CNM          Janice Aguilar, CNM 10/07/2012 9:49 AM

## 2012-11-29 HISTORY — PX: BREAST ENHANCEMENT SURGERY: SHX7

## 2013-05-03 ENCOUNTER — Ambulatory Visit (INDEPENDENT_AMBULATORY_CARE_PROVIDER_SITE_OTHER): Payer: BC Managed Care – PPO | Admitting: Family Medicine

## 2013-05-03 ENCOUNTER — Encounter: Payer: Self-pay | Admitting: Family Medicine

## 2013-05-03 VITALS — BP 125/71 | HR 75 | Temp 97.2°F | Ht 67.0 in | Wt 143.0 lb

## 2013-05-03 DIAGNOSIS — Z0000 Encounter for general adult medical examination without abnormal findings: Secondary | ICD-10-CM

## 2013-05-03 LAB — COMPLETE METABOLIC PANEL WITH GFR
AST: 16 U/L (ref 0–37)
BUN: 10 mg/dL (ref 6–23)
CO2: 26 mEq/L (ref 19–32)
Calcium: 9.1 mg/dL (ref 8.4–10.5)
Chloride: 106 mEq/L (ref 96–112)
Creat: 0.68 mg/dL (ref 0.50–1.10)
GFR, Est African American: >89 mL/min
GFR, Est Non African American: >89 mL/min

## 2013-05-03 LAB — LIPID PANEL
Cholesterol: 170 mg/dL (ref 0–200)
Triglycerides: 38 mg/dL (ref <150)

## 2013-05-03 NOTE — Patient Instructions (Signed)
Keep up a regular exercise program and make sure you are eating a healthy diet Try to eat 4 servings of dairy a day, or if you are lactose intolerant take a calcium with vitamin D daily.  Your vaccines are up to date.   

## 2013-05-03 NOTE — Progress Notes (Signed)
  Subjective:     Janice Aguilar is a 32 y.o. female and is here for a comprehensive physical exam. The patient reports no problems.  History   Social History  . Marital Status: Married    Spouse Name: N/A    Number of Children: 3  . Years of Education: N/A   Occupational History  . LICENSED PRODUCER    Social History Main Topics  . Smoking status: Former Smoker -- 0.50 packs/day for 10 years    Quit date: 01/15/2005  . Smokeless tobacco: Never Used  . Alcohol Use: No  . Drug Use: No  . Sexual Activity: Yes   Other Topics Concern  . Not on file   Social History Narrative   No regular exercise.    Health Maintenance  Topic Date Due  . Influenza Vaccine  01/01/2014  . Pap Smear  10/08/2015  . Tetanus/tdap  07/06/2022    The following portions of the patient's history were reviewed and updated as appropriate: allergies, current medications, past family history, past medical history, past social history, past surgical history and problem list.  Review of Systems A comprehensive review of systems was negative.   Objective:    BP 125/71  Pulse 75  Temp(Src) 97.2 F (36.2 C)  Ht 5\' 7"  (1.702 m)  Wt 143 lb (64.864 kg)  BMI 22.39 kg/m2 General appearance: alert, cooperative and appears stated age Head: Normocephalic, without obvious abnormality, atraumatic Eyes: conj clear, EOMi, PEERLA Ears: normal TM's and external ear canals both ears Nose: Nares normal. Septum midline. Mucosa normal. No drainage or sinus tenderness. Throat: lips, mucosa, and tongue normal; teeth and gums normal Neck: no adenopathy, no carotid bruit, no JVD, supple, symmetrical, trachea midline and thyroid not enlarged, symmetric, no tenderness/mass/nodules Back: symmetric, no curvature. ROM normal. No CVA tenderness. Lungs: clear to auscultation bilaterally Breasts: normal appearance, no masses or tenderness, surgical scars are well healed.  Heart: regular rate and rhythm, S1, S2 normal, no  murmur, click, rub or gallop Abdomen: soft, non-tender; bowel sounds normal; no masses,  no organomegaly Extremities: extremities normal, atraumatic, no cyanosis or edema Pulses: 2+ and symmetric Skin: Skin color, texture, turgor normal. No rashes or lesions Lymph nodes: Cervical, supraclavicular, and axillary nodes normal. Neurologic: Alert and oriented X 3, normal strength and tone. Normal symmetric reflexes. Normal coordination and gait    Assessment:    Healthy female exam.      Plan:     See After Visit Summary for Counseling Recommendations  Keep up a regular exercise program and make sure you are eating a healthy diet Try to eat 4 servings of dairy a day, or if you are lactose intolerant take a calcium with vitamin D daily.  Your vaccines are up to date.

## 2013-05-04 NOTE — Progress Notes (Signed)
Quick Note:  All labs are normal. ______ 

## 2013-10-06 ENCOUNTER — Ambulatory Visit (INDEPENDENT_AMBULATORY_CARE_PROVIDER_SITE_OTHER): Payer: BC Managed Care – PPO | Admitting: Family

## 2013-10-06 ENCOUNTER — Encounter: Payer: Self-pay | Admitting: Family

## 2013-10-06 VITALS — BP 146/84 | HR 87 | Resp 16 | Ht 67.0 in | Wt 144.0 lb

## 2013-10-06 DIAGNOSIS — Z8742 Personal history of other diseases of the female genital tract: Secondary | ICD-10-CM

## 2013-10-06 DIAGNOSIS — Z124 Encounter for screening for malignant neoplasm of cervix: Secondary | ICD-10-CM

## 2013-10-06 DIAGNOSIS — Z01419 Encounter for gynecological examination (general) (routine) without abnormal findings: Secondary | ICD-10-CM

## 2013-10-06 MED ORDER — ACYCLOVIR 200 MG PO CAPS: 200.0000 mg | ORAL_CAPSULE | Freq: Every day | ORAL | Status: DC

## 2013-10-06 NOTE — Progress Notes (Signed)
  Subjective:     Janice Aguilar is a 33 y.o. female here for a routine exam.  History of abnormal pap smear:  Pap smear on August 2012 showed: low-grade squamous intraepithelial neoplasia (LGSIL - encompassing HPV,mild dysplasia,CIN I). Colposcopy May 2013 showed benign cell, no malignancy.  Pap smear in November 2013 and May 2014 negative.  . Prior cervical treatment: no treatment.  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  Personal health questionnaire reviewed: yes.   Gynecologic History Patient's last menstrual period was 09/23/2013. Contraception: vasectomy Last Pap: May 2014. Results were: normal Last mammogram: n/a.   Obstetric History OB History  Gravida Para Term Preterm AB SAB TAB Ectopic Multiple Living  3 3 3       3     # Outcome Date GA Lbr Len/2nd Weight Sex Delivery Anes PTL Lv  3 TRM 08/22/11 5265w3d  6 lb 11.1 oz (3.035 kg) M SVD None  Y  2 TRM 10/04/08 3581w1d  8 lb 6 oz (3.799 kg) M SVD None N Y  1 TRM 03/03/00 461w0d  7 lb 10.5 oz (3.473 kg) F SVD EPI N Y       The following portions of the patient's history were reviewed and updated as appropriate: allergies, current medications, past family history, past medical history, past social history, past surgical history and problem list.  Review of Systems Pertinent items are noted in HPI.    Objective:  BP 146/84  Pulse 87  Resp 16  Ht 5\' 7"  (1.702 m)  Wt 144 lb (65.318 kg)  BMI 22.55 kg/m2  LMP 09/23/2013 General appearance: alert, cooperative and appears stated age Head: Normocephalic, without obvious abnormality, atraumatic Neck: no adenopathy, no carotid bruit, no JVD, supple, symmetrical, trachea midline and thyroid not enlarged, symmetric, no tenderness/mass/nodules Lungs: clear to auscultation bilaterally Breasts: normal appearance, no masses or tenderness, No nipple retraction or dimpling, No nipple discharge or bleeding, No axillary or  supraclavicular adenopathy, Normal to palpation without dominant masses.  Breast implants palpated and intact.  Taught monthly breast self examination Heart: regular rate and rhythm, S1, S2 normal, no murmur, click, rub or gallop Abdomen: soft, non-tender; bowel sounds normal; no masses,  no organomegaly Pelvic: cervix normal in appearance, external genitalia normal, no adnexal masses or tenderness, no cervical motion tenderness, rectovaginal septum normal, uterus normal size, shape, and consistency and vagina normal without discharge Skin: Skin color, texture, turgor normal. No rashes or lesions    Assessment:    Healthy female exam History of abnormal Pap Smear.    Plan:   Pap smear sent to lab, will follow-up as indicated by results. Eino FarberWalidah Paul HalfN Muhammad, CNM

## 2014-04-02 ENCOUNTER — Encounter: Payer: Self-pay | Admitting: Family

## 2014-06-11 ENCOUNTER — Other Ambulatory Visit: Payer: Self-pay | Admitting: Family

## 2014-08-05 ENCOUNTER — Encounter: Payer: Self-pay | Admitting: Emergency Medicine

## 2014-08-05 ENCOUNTER — Emergency Department (INDEPENDENT_AMBULATORY_CARE_PROVIDER_SITE_OTHER)
Admission: EM | Admit: 2014-08-05 | Discharge: 2014-08-05 | Disposition: A | Discharge status: Home / Self Care | Payer: PRIVATE HEALTH INSURANCE | Source: Home / Self Care | Attending: Emergency Medicine | Admitting: Emergency Medicine

## 2014-08-05 DIAGNOSIS — J020 Streptococcal pharyngitis: Secondary | ICD-10-CM | POA: Diagnosis not present

## 2014-08-05 LAB — POCT RAPID STREP A (OFFICE): RAPID STREP A SCREEN: POSITIVE — AB

## 2014-08-05 MED ORDER — AMOXICILLIN 500 MG PO CAPS: 500.0000 mg | ORAL_CAPSULE | Freq: Three times a day (TID) | ORAL | Status: DC

## 2014-08-05 NOTE — ED Notes (Signed)
Reports sore throat and fever x 36 hours.

## 2014-08-05 NOTE — ED Provider Notes (Signed)
CSN: [id number]     Arrival date & time 08/05/14  1130 History   First MD Initiated Contact with Patient 08/05/14 1217     Chief Complaint  Patient presents with  . Sore Throat   (Consider location/radiation/quality/duration/timing/severity/associated sxs/prior Treatment) Patient is a 34 y.o. female presenting with pharyngitis. The history is provided by the patient and a parent. No language interpreter was used.  Sore Throat This is a new problem. The current episode started more than 2 days ago. The problem occurs constantly. The problem has not changed since onset.Nothing aggravates the symptoms. Nothing relieves the symptoms. She has tried nothing for the symptoms. The treatment provided no relief.    Past Medical History  Diagnosis Date  . Anemia   . Abnormal Pap smear   . 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 Herniated disc    Past Surgical History  Procedure Laterality Date  . Tonsillectomy     Family History  Problem Relation Age of Onset  . Hypertension Father   . Heart disease Mother   . Diabetes Maternal Grandmother    History  Substance Use Topics  . Smoking status: Former Smoker -- 0.50 packs/day for 10 years    Quit date: 01/15/2005  . Smokeless tobacco: Never Used  . Alcohol Use: No   OB History    Gravida Para Term Preterm AB TAB SAB Ectopic Multiple Living   3 3 3       3      Review of Systems  HENT: Positive for sore throat.   All other systems reviewed and are negative.   Allergies  Review of patient's allergies indicates no known allergies.  Home Medications   Prior to Admission medications   Medication Sig Start Date End Date Taking? Authorizing Provider  acyclovir (ZOVIRAX) 200 MG capsule TAKE ONE CAPSULE BY MOUTH EVERY 4 HOURS FOR 5 DAYS FOR OUTBREAK 04/10/12   Deirdre C Poe, CNM  acyclovir (ZOVIRAX) 200 MG capsule Take 1 capsule (200 mg total) by mouth 5 (five) times daily. 10/06/13   Marlis EdelsonWalidah N Karim, CNM  acyclovir (ZOVIRAX) 200  MG capsule TAKE 1 CAPSULE (200 MG TOTAL) BY MOUTH 5 (FIVE) TIMES DAILY. 06/13/14   Marlis EdelsonWalidah N Karim, CNM  acyclovir (ZOVIRAX) 400 MG tablet TAKE 1 TABLET (400 MG TOTAL) BY MOUTH 2 (TWO) TIMES DAILY. 04/10/12   Deirdre Colin Mulders Poe, CNM  amoxicillin (AMOXIL) 500 MG capsule Take 1 capsule (500 mg total) by mouth 3 (three) times daily. 08/05/14   Elson AreasLeslie K Man Bonneau, PA-C   BP 116/81 mmHg  Pulse 104  Temp(Src) 99.3 F (37.4 C) (Oral)  Resp 16  Ht 5\' 7"  (1.702 m)  Wt 140 lb (63.504 kg)  BMI 21.92 kg/m2  SpO2 98%  LMP 07/17/2014 Physical Exam  Constitutional: She is oriented to person, place, and time. She appears well-developed and well-nourished.  HENT:  Head: Normocephalic and atraumatic.  Right Ear: External ear normal.  Left Ear: External ear normal.  Erythematous throat  Eyes: EOM are normal.  Neck: Normal range of motion.  Pulmonary/Chest: Effort normal.  Abdominal: She exhibits no distension.  Musculoskeletal: Normal range of motion.  Neurological: She is alert and oriented to person, place, and time.  Skin: Skin is warm.  Psychiatric: She has a normal mood and affect.  Nursing note and vitals reviewed.   ED Course  Procedures (including critical care time) Labs Review Labs Reviewed  POCT RAPID STREP A (OFFICE) - Abnormal; Notable for the following:  Rapid Strep A Screen Positive (*)    All other components within normal limits    Imaging Review No results found.   MDM   1. Strep pharyngitis    Amoxicillin AVS Return if any problems     Elson Areas, PA-C 08/05/14 1758

## 2014-08-05 NOTE — Discharge Instructions (Signed)

## 2014-09-27 ENCOUNTER — Encounter: Payer: Self-pay | Admitting: Family Medicine

## 2014-09-27 ENCOUNTER — Ambulatory Visit (INDEPENDENT_AMBULATORY_CARE_PROVIDER_SITE_OTHER): Payer: PRIVATE HEALTH INSURANCE | Admitting: Family Medicine

## 2014-09-27 VITALS — BP 123/83 | HR 82 | Temp 98.1°F | Ht 67.0 in | Wt 153.0 lb

## 2014-09-27 DIAGNOSIS — Z0000 Encounter for general adult medical examination without abnormal findings: Secondary | ICD-10-CM

## 2014-09-27 LAB — COMPLETE METABOLIC PANEL WITH GFR
ALBUMIN: 4.2 g/dL (ref 3.5–5.2)
ALT: 15 U/L (ref 0–35)
AST: 19 U/L (ref 0–37)
Alkaline Phosphatase: 33 U/L — ABNORMAL LOW (ref 39–117)
BUN: 13 mg/dL (ref 6–23)
CALCIUM: 8.9 mg/dL (ref 8.4–10.5)
CO2: 24 meq/L (ref 19–32)
Chloride: 107 mEq/L (ref 96–112)
Creat: 0.7 mg/dL (ref 0.50–1.10)
GLUCOSE: 77 mg/dL (ref 70–99)
POTASSIUM: 4.2 meq/L (ref 3.5–5.3)
Sodium: 139 mEq/L (ref 135–145)
TOTAL PROTEIN: 7.1 g/dL (ref 6.0–8.3)
Total Bilirubin: 0.5 mg/dL (ref 0.2–1.2)

## 2014-09-27 LAB — LIPID PANEL
CHOL/HDL RATIO: 1.7 ratio
CHOLESTEROL: 180 mg/dL (ref 0–200)
HDL: 109 mg/dL (ref >=46)
LDL Cholesterol: 62 mg/dL (ref 0–99)
Triglycerides: 43 mg/dL (ref <150)
VLDL: 9 mg/dL (ref 0–40)

## 2014-09-27 NOTE — Patient Instructions (Signed)
Keep up a regular exercise program and make sure you are eating a healthy diet Try to eat 4 servings of dairy a day, or if you are lactose intolerant take a calcium with vitamin D daily.  Your vaccines are up to date.   

## 2014-09-27 NOTE — Progress Notes (Signed)
  Subjective:     Janice Aguilar is a 34 y.o. female and is here for a comprehensive physical exam. The patient reports problems - occ low back pain. .will get a burning pain on her left side pain. Worse with walking. Comes and goes.   History   Social History  . Marital Status: Married    Spouse Name: N/A  . Number of Children: 3  . Years of Education: N/A   Occupational History  . LICENSED PRODUCER    Social History Main Topics  . Smoking status: Former Smoker -- 0.50 packs/day for 10 years    Quit date: 01/15/2005  . Smokeless tobacco: Never Used  . Alcohol Use: No  . Drug Use: No  . Sexual Activity: Yes   Other Topics Concern  . Not on file   Social History Narrative   No regular exercise.    Health Maintenance  Topic Date Due  . INFLUENZA VACCINE  12/31/2014  . PAP SMEAR  10/06/2016  . TETANUS/TDAP  07/06/2022  . HIV Screening  Completed    The following portions of the patient's history were reviewed and updated as appropriate: allergies, current medications, past family history, past medical history, past social history, past surgical history and problem list.  Review of Systems A comprehensive review of systems was negative.   Objective:    BP 123/83 mmHg  Pulse 82  Temp(Src) 98.1 F (36.7 C) (Oral)  Ht 5\' 7"  (1.702 m)  Wt 153 lb (69.4 kg)  BMI 23.96 kg/m2  SpO2 100% General appearance: alert, cooperative and appears stated age Head: Normocephalic, without obvious abnormality, atraumatic Eyes: conj clear, EOMI, PEERLA Ears: normal TM's and external ear canals both ears Nose: Nares normal. Septum midline. Mucosa normal. No drainage or sinus tenderness. Throat: lips, mucosa, and tongue normal; teeth and gums normal Neck: no adenopathy, no carotid bruit, no JVD, supple, symmetrical, trachea midline and thyroid not enlarged, symmetric, no tenderness/mass/nodules Back: symmetric, no curvature. ROM normal. No CVA tenderness. Lungs: clear to  auscultation bilaterally Heart: regular rate and rhythm, S1, S2 normal, no murmur, click, rub or gallop Abdomen: soft, non-tender; bowel sounds normal; no masses,  no organomegaly Extremities: extremities normal, atraumatic, no cyanosis or edema Pulses: 2+ and symmetric Skin: Skin color, texture, turgor normal. No rashes or lesions Lymph nodes: Cervical, supraclavicular, and axillary nodes normal. Neurologic: Alert and oriented X 3, normal strength and tone. Normal symmetric reflexes. Normal coordination and gait    Assessment:    Healthy female exam.      Plan:  Keep up a regular exercise program and make sure you are eating a healthy diet Try to eat 4 servings of dairy a day, or if you are lactose intolerant take a calcium with vitamin D daily.  Your vaccines are up to date. Wear sunscreen.

## 2014-11-02 ENCOUNTER — Ambulatory Visit (INDEPENDENT_AMBULATORY_CARE_PROVIDER_SITE_OTHER): Payer: PRIVATE HEALTH INSURANCE | Admitting: Family

## 2014-11-02 ENCOUNTER — Encounter: Payer: Self-pay | Admitting: Family

## 2014-11-02 VITALS — BP 113/77 | HR 74 | Ht 67.0 in | Wt 153.0 lb

## 2014-11-02 DIAGNOSIS — Z124 Encounter for screening for malignant neoplasm of cervix: Secondary | ICD-10-CM | POA: Diagnosis not present

## 2014-11-02 DIAGNOSIS — Z01419 Encounter for gynecological examination (general) (routine) without abnormal findings: Secondary | ICD-10-CM | POA: Diagnosis not present

## 2014-11-02 DIAGNOSIS — Z1151 Encounter for screening for human papillomavirus (HPV): Secondary | ICD-10-CM

## 2014-11-02 NOTE — Progress Notes (Signed)
  Subjective:     Janice Aguilar is a 34 y.o. female here for a routine exam.  Current complaints: none. Doing well, adjusting to being an agent.  Daughter starting middle college next year.     Gynecologic History No LMP recorded. Contraception: vasectomy Last Pap: May 2015. Results were: normal   Obstetric History OB History  Gravida Para Term Preterm AB SAB TAB Ectopic Multiple Living  3 3 3       3     # Outcome Date GA Lbr Len/2nd Weight Sex Delivery Anes PTL Lv  3 Term 08/22/11 559w3d  6 lb 11.1 oz (3.035 kg) M Vag-Spont None  Y  2 Term 10/04/08 6862w1d  8 lb 6 oz (3.799 kg) M Vag-Spont None N Y  1 Term 03/03/00 2833w0d  7 lb 10.5 oz (3.473 kg) F Vag-Spont EPI N Y       The following portions of the patient's history were reviewed and updated as appropriate: allergies, current medications, past family history, past medical history, past social history, past surgical history and problem list.  Review of Systems Pertinent items are noted in HPI.    Objective:   BP 113/77 mmHg  Pulse 74  Ht 5\' 7"  (1.702 m)  Wt 153 lb (69.4 kg)  BMI 23.96 kg/m2  LMP 10/09/2014 General appearance: alert, cooperative and appears stated age Head: Normocephalic, without obvious abnormality, atraumatic Neck: no adenopathy, no carotid bruit, no JVD, supple, symmetrical, trachea midline and thyroid not enlarged, symmetric, no tenderness/mass/nodules Lungs: clear to auscultation bilaterally Breasts: normal appearance, no masses or tenderness, No nipple retraction or dimpling, No nipple discharge or bleeding, No axillary or supraclavicular adenopathy, Normal to palpation without dominant masses, Taught monthly breast self examination Heart: regular rate and rhythm, S1, S2 normal, no murmur, click, rub or gallop Abdomen: soft, non-tender; bowel sounds normal; no masses,  no organomegaly Pelvic: cervix normal in appearance, external genitalia normal, no adnexal masses or tenderness, no cervical motion  tenderness, rectovaginal septum normal, uterus normal size, shape, and consistency and vagina normal without discharge Skin: Skin color, texture, turgor normal. No rashes or lesions     Assessment:    Healthy female exam.    Plan:    Education reviewed: self breast exams and weight bearing exercise. Contraception: vasectomy. Follow up in: one year.     Eino FarberWalidah Kennith GainN Karim, CNM

## 2014-11-05 LAB — CYTOLOGY - PAP

## 2015-04-22 ENCOUNTER — Other Ambulatory Visit: Payer: Self-pay | Admitting: Family

## 2015-05-10 ENCOUNTER — Ambulatory Visit (INDEPENDENT_AMBULATORY_CARE_PROVIDER_SITE_OTHER): Payer: PRIVATE HEALTH INSURANCE | Admitting: Family Medicine

## 2015-05-10 ENCOUNTER — Encounter: Payer: Self-pay | Admitting: Family Medicine

## 2015-05-10 VITALS — BP 143/65 | HR 93 | Wt 160.0 lb

## 2015-05-10 DIAGNOSIS — R197 Diarrhea, unspecified: Secondary | ICD-10-CM | POA: Diagnosis not present

## 2015-05-10 LAB — CBC WITH DIFFERENTIAL/PLATELET
BASOS ABS: 0 10*3/uL (ref 0.0–0.1)
Basophils Relative: 0 % (ref 0–1)
EOS ABS: 0.1 10*3/uL (ref 0.0–0.7)
EOS PCT: 1 % (ref 0–5)
HEMATOCRIT: 37.6 % (ref 36.0–46.0)
Hemoglobin: 12.2 g/dL (ref 12.0–15.0)
LYMPHS PCT: 33 % (ref 12–46)
Lymphs Abs: 1.8 10*3/uL (ref 0.7–4.0)
MCH: 26.8 pg (ref 26.0–34.0)
MCHC: 32.4 g/dL (ref 30.0–36.0)
MCV: 82.5 fL (ref 78.0–100.0)
MONO ABS: 0.4 10*3/uL (ref 0.1–1.0)
MPV: 10.6 fL (ref 8.6–12.4)
Monocytes Relative: 8 % (ref 3–12)
Neutro Abs: 3.2 10*3/uL (ref 1.7–7.7)
Neutrophils Relative %: 58 % (ref 43–77)
PLATELETS: 300 10*3/uL (ref 150–400)
RBC: 4.56 MIL/uL (ref 3.87–5.11)
RDW: 14.7 % (ref 11.5–15.5)
WBC: 5.6 10*3/uL (ref 4.0–10.5)

## 2015-05-10 NOTE — Progress Notes (Signed)
   Subjective:    Patient ID: Janice Aguilar, female    DOB: [birth year], 34 y.o.   MRN: [id number]  HPI  Says since having kids has had more problems with constipation.  Will occ get diarrhea right before her menstrual cycle but now getting more episodes of diarrhea over the last 4-6 weeks. .  Stools are watery.  No camping or hiking.  Has been more nauseated frequently.  No blood in the stool. Says the bowels are flipping between diarrhea and constipation. No GERD or reflux.  No recent antibiotics.  [place] before sxs started.     Review of Systems     Objective:   Physical Exam  Constitutional: She is oriented to person, place, and time. She appears well-developed and well-nourished.  HENT:  Head: Normocephalic and atraumatic.  Cardiovascular: Normal rate, regular rhythm and normal heart sounds.   Pulmonary/Chest: Effort normal and breath sounds normal.  Abdominal: Soft. Bowel sounds are normal. She exhibits no distension and no mass. There is no tenderness. There is no rebound and no guarding.  Neurological: She is alert and oriented to person, place, and time.  Skin: Skin is warm and dry.  Psychiatric: She has a normal mood and affect. Her behavior is normal.          Assessment & Plan:  Diarrhea x 1 months - will check for infectious cuases. Will check CBC and stool cultures. [place] did [place] this happen. Will check for C. Diff though no recent antibiotics.  Will call with results once available.  Can use Immodium PRN.

## 2015-05-10 NOTE — Patient Instructions (Signed)

## 2015-05-11 LAB — COMPLETE METABOLIC PANEL WITH GFR
ALT: 9 U/L (ref 6–29)
AST: 16 U/L (ref 10–30)
Albumin: 4.1 g/dL (ref 3.6–5.1)
Alkaline Phosphatase: 35 U/L (ref 33–115)
BUN: 11 mg/dL (ref 7–25)
CHLORIDE: 104 mmol/L (ref 98–110)
CO2: 26 mmol/L (ref 20–31)
Calcium: 9.1 mg/dL (ref 8.6–10.2)
Creat: 0.77 mg/dL (ref 0.50–1.10)
GFR, Est Non African American: >89 mL/min (ref >=60)
Glucose, Bld: 80 mg/dL (ref 65–99)
POTASSIUM: 4.5 mmol/L (ref 3.5–5.3)
Sodium: 139 mmol/L (ref 135–146)
Total Bilirubin: 0.4 mg/dL (ref 0.2–1.2)
Total Protein: 6.5 g/dL (ref 6.1–8.1)

## 2015-05-11 LAB — LIPASE: Lipase: 34 U/L (ref 7–60)

## 2015-05-11 LAB — AMYLASE: AMYLASE: 37 U/L (ref 0–105)

## 2015-09-19 ENCOUNTER — Other Ambulatory Visit: Payer: Self-pay | Admitting: *Deleted

## 2015-09-19 DIAGNOSIS — B009 Herpesviral infection, unspecified: Secondary | ICD-10-CM

## 2015-09-19 MED ORDER — ACYCLOVIR 200 MG PO CAPS: 200.0000 mg | ORAL_CAPSULE | Freq: Every day | ORAL | Status: DC

## 2015-09-19 NOTE — Telephone Encounter (Signed)
RF authorization for Acyclovir sent to [facility] in [place]

## 2015-09-30 ENCOUNTER — Ambulatory Visit: Payer: PRIVATE HEALTH INSURANCE

## 2015-09-30 ENCOUNTER — Ambulatory Visit (INDEPENDENT_AMBULATORY_CARE_PROVIDER_SITE_OTHER): Payer: PRIVATE HEALTH INSURANCE | Admitting: Family Medicine

## 2015-09-30 ENCOUNTER — Encounter: Payer: Self-pay | Admitting: Family Medicine

## 2015-09-30 VITALS — BP 141/90 | HR 79 | Ht 67.0 in | Wt 160.0 lb

## 2015-09-30 DIAGNOSIS — R2242 Localized swelling, mass and lump, left lower limb: Secondary | ICD-10-CM | POA: Diagnosis not present

## 2015-09-30 DIAGNOSIS — W57XXXA Bitten or stung by nonvenomous insect and other nonvenomous arthropods, initial encounter: Secondary | ICD-10-CM | POA: Diagnosis not present

## 2015-09-30 DIAGNOSIS — R2240 Localized swelling, mass and lump, unspecified lower limb: Secondary | ICD-10-CM | POA: Insufficient documentation

## 2015-09-30 DIAGNOSIS — T148 Other injury of unspecified body region: Secondary | ICD-10-CM | POA: Diagnosis not present

## 2015-09-30 MED ORDER — APIXABAN 5 MG PO TABS: ORAL_TABLET | ORAL | Status: DC

## 2015-09-30 MED ORDER — DOXYCYCLINE HYCLATE 100 MG PO TABS: 100.0000 mg | ORAL_TABLET | Freq: Two times a day (BID) | ORAL | Status: DC

## 2015-09-30 NOTE — Progress Notes (Signed)
Janice Aguilar is a 35 y.o. female who [facility] today for tick bite and leg mass.  1) patient suffered 2 tick bites to her right leg over the weekend. She notes the ticks were firmly implanted but not engorged. She notes mild redness at the site of the tick bites and feels well otherwise. She notes her mother had Lyme disease and got very sick with that and is very worried about the possibility of Lyme disease. She feels well otherwise.  2) leg mass. Patient notes a mass on the left inner thigh. This is tender and bluish colored. She cannot recall any injury to the mass site. She denies any trouble breathing or recent periods of immobility. She feels well otherwise.   Past Medical History  Diagnosis Date  . Anemia   . Abnormal Pap smear   . 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 Herniated disc    Past Surgical History  Procedure Laterality Date  . Tonsillectomy    . Breast enhancement surgery  11/2012    Silicone implants   Social History  Substance Use Topics  . Smoking status: Former Smoker -- 0.50 packs/day for 10 years    Quit date: 01/15/2005  . Smokeless tobacco: Never Used  . Alcohol Use: No   family history includes Diabetes in her maternal grandmother; Heart disease in her mother; Hypertension in her father.  ROS as above Medications: Current Outpatient Prescriptions  Medication Sig Dispense Refill  . acyclovir (ZOVIRAX) 200 MG capsule Take 1 capsule (200 mg total) by mouth daily. 90 capsule 1  . apixaban (ELIQUIS) 5 MG TABS tablet 10 mg twice daily for 7 days followed by 5 mg twice daily 70 tablet 0  . doxycycline (VIBRA-TABS) 100 MG tablet Take 1 tablet (100 mg total) by mouth 2 (two) times daily. 14 tablet 0  . [DISCONTINUED] doxylamine, Sleep, (UNISOM) 25 MG tablet Take 25 mg by mouth at bedtime as needed.       No current  facility-administered medications for this visit.   No Known Allergies   Exam:  BP 141/90 mmHg  Pulse 79  Ht 5\' 7"  (1.702 m)  Wt 160 lb (72.576 kg)  BMI 25.05 kg/m2 Gen: Well NAD HEENT: EOMI,  MMM Lungs: Normal work of breathing. CTABL Heart: RRR no MRG Abd: NABS, Soft. Nondistended, Nontender Exts: Brisk capillary refill, warm and well perfused. Skin 1-2 cm bluish tender mobile mass left inner thigh. Skin is not erythematous at the site of the mass Skin: 2 small erythematous papules right leg otherwise normal  No results found for this or any previous visit (from the past 24 hour(s)). [place] Venous Img Lower Unilateral Left  09/30/2015  CLINICAL DATA:  35 year old female with superficial left upper inner thigh mass and bruising for the past 1-2 days. No known injury. EXAM: LEFT LOWER EXTREMITY VENOUS DOPPLER ULTRASOUND TECHNIQUE: Gray-scale sonography with graded compression, as well as color Doppler and duplex ultrasound were performed to evaluate the lower extremity deep venous systems from the level of the common femoral vein and including the common femoral, femoral, profunda femoral, popliteal and calf veins including the posterior tibial, peroneal and gastrocnemius veins when visible. The superficial great saphenous vein was also interrogated. Spectral Doppler was utilized to evaluate flow at rest and with distal augmentation maneuvers in the common femoral, femoral and popliteal veins. COMPARISON:  None. FINDINGS: Contralateral Common Femoral Vein: Respiratory phasicity is normal and  symmetric with the symptomatic side. No evidence of thrombus. Normal compressibility. Common Femoral Vein: No evidence of thrombus. Normal compressibility, respiratory phasicity and response to augmentation. Saphenofemoral Junction: No evidence of thrombus. Normal compressibility and flow on color Doppler imaging. Profunda Femoral Vein: No evidence of thrombus. Normal compressibility and flow on color Doppler  imaging. Femoral Vein: No evidence of thrombus. Normal compressibility, respiratory phasicity and response to augmentation. Popliteal Vein: No evidence of thrombus. Normal compressibility, respiratory phasicity and response to augmentation. Calf Veins: No evidence of thrombus. Normal compressibility and flow on color Doppler imaging. Superficial Great Saphenous Vein: No evidence of thrombus. Normal compressibility and flow on color Doppler imaging. Venous Reflux:  None. Other Findings:  None. IMPRESSION: 1. No evidence of deep or superficial venous thrombosis. 2. The region of clinical concern corresponds with a ill-defined approximately 3.6 x 1.6 x 3.0 cm complex fluid collection with surrounding edema in the superficial subcutaneous fat of the upper medial thigh. Differential considerations include hematoma, and potentially cellulitis with developing abscess. Electronically Signed   By: Malachy Moan M.D.   On: 09/30/2015 16:04     Please see individual assessment and plan sections.

## 2015-09-30 NOTE — Patient Instructions (Signed)
Thank you for coming in today. Take doxycycline for tick bite.  It will be less than $26 at [facility].  Wear sunscreen while taking this medicine.  If you Ultrasound shows a DVT start taking Eliquis blood thinner.    Tick Bite Information Ticks are insects that attach themselves to the skin and draw blood for food. There are various types of ticks. Common types include wood ticks and deer ticks. Most ticks live in shrubs and grassy areas. Ticks can climb onto your body when you make contact with leaves or grass where the tick is waiting. The most common places on the body for ticks to attach themselves are the scalp, neck, armpits, waist, and groin. Most tick bites are harmless, but sometimes ticks carry germs that cause diseases. These germs can be spread to a person during the tick's feeding process. The chance of a disease spreading through a tick bite depends on:   The type of tick.  Time of year.   How long the tick is attached.   Geographic location.  HOW CAN YOU PREVENT TICK BITES? Take these steps to help prevent tick bites when you are outdoors:  Wear protective clothing. Long sleeves and long pants are best.   Wear white clothes so you can see ticks more easily.  Tuck your pant legs into your socks.   If walking on a trail, stay in the middle of the trail to avoid brushing against bushes.  Avoid walking through areas with long grass.  Put insect repellent on all exposed skin and along boot tops, pant legs, and sleeve cuffs.   Check clothing, hair, and skin repeatedly and before going inside.   Brush off any ticks that are not attached.  Take a shower or bath as soon as possible after being outdoors.  WHAT IS THE PROPER WAY TO REMOVE A TICK? Ticks should be removed as soon as possible to help prevent diseases caused by tick bites. 1. If latex gloves are available, put them on before trying to remove a tick.  2. Using fine-point tweezers, grasp the tick as close  to the skin as possible. You may also use curved forceps or a tick removal tool. Grasp the tick as close to its head as possible. Avoid grasping the tick on its body. 3. Pull gently with steady upward pressure until the tick lets go. Do not twist the tick or jerk it suddenly. This may break off the tick's head or mouth parts. 4. Do not squeeze or crush the tick's body. This could force disease-carrying fluids from the tick into your body.  5. After the tick is removed, wash the bite area and your hands with soap and water or other disinfectant such as alcohol. 6. Apply a small amount of antiseptic cream or ointment to the bite site.  7. Wash and disinfect any instruments that were used.  Do not try to remove a tick by applying a hot match, petroleum jelly, or fingernail polish to the tick. These methods do not work and may increase the chances of disease being spread from the tick bite.  WHEN SHOULD YOU SEEK MEDICAL CARE? Contact your health care provider if you are unable to remove a tick from your skin or if a part of the tick breaks off and is stuck in the skin.  After a tick bite, you need to be aware of signs and symptoms that could be related to diseases spread by ticks. Contact your health care provider if you  develop any of the following in the days or weeks after the tick bite:  Unexplained fever.  Rash. A circular rash that appears days or weeks after the tick bite may indicate the possibility of Lyme disease. The rash may resemble a target with a bull's-eye and may occur at a different part of your body than the tick bite.  Redness and swelling in the area of the tick bite.   Tender, swollen lymph glands.   Diarrhea.   Weight loss.   Cough.   Fatigue.   Muscle, joint, or bone pain.   Abdominal pain.   Headache.   Lethargy or a change in your level of consciousness.  Difficulty walking or moving your legs.   Numbness in the legs.    Paralysis.  Shortness of breath.   Confusion.   Repeated vomiting.    This information is not intended to replace advice given to you by your health care provider. Make sure you discuss any questions you have with your health care provider.   Document Released: 05/15/2000 Document Revised: 06/08/2014 Document Reviewed: 10/26/2012 Elsevier Interactive Patient Education 2016 [employer]   Apixaban oral tablets What is this medicine? APIXABAN (a PIX a ban) is an anticoagulant (blood thinner). It is used to lower the chance of stroke in people with a medical condition called atrial fibrillation. It is also used to treat or prevent blood clots in the lungs or in the veins. This medicine may be used for other purposes; ask your health care provider or pharmacist if you have questions. What should I tell my health care provider before I take this medicine? They need to know if you have any of these conditions: -bleeding disorders -bleeding in the brain -blood in your stools (black or tarry stools) or if you have blood in your vomit -history of stomach bleeding -kidney disease -liver disease -mechanical heart valve -an unusual or allergic reaction to apixaban, other medicines, foods, dyes, or preservatives -pregnant or trying to get pregnant -breast-feeding How should I use this medicine? Take this medicine by mouth with a glass of water. Follow the directions on the prescription label. You can take it with or without food. If it upsets your stomach, take it with food. Take your medicine at regular intervals. Do not take it more often than directed. Do not stop taking except on your doctor's advice. Stopping this medicine may increase your risk of a blot clot. Be sure to refill your prescription before you run out of medicine. Talk to your pediatrician regarding the use of this medicine in children. Special care may be needed. Overdosage: If you think you have taken too much of  this medicine contact a poison control center or emergency room at once. NOTE: This medicine is only for you. Do not share this medicine with others. What if I miss a dose? If you miss a dose, take it as soon as you can. If it is almost time for your next dose, take only that dose. Do not take double or extra doses. What may interact with this medicine? This medicine may interact with the following: -aspirin and aspirin-like medicines -certain medicines for fungal infections like ketoconazole and itraconazole -certain medicines for seizures like carbamazepine and phenytoin -certain medicines that treat or prevent blood clots like warfarin, enoxaparin, and dalteparin -clarithromycin -NSAIDs, medicines for pain and inflammation, like ibuprofen or naproxen -rifampin -ritonavir -St. John's wort This list may not describe all possible interactions. Give your health care provider a list  of all the medicines, herbs, non-prescription drugs, or dietary supplements you use. Also tell them if you smoke, drink alcohol, or use illegal drugs. Some items may interact with your medicine. What should I watch for while using this medicine? Notify your doctor or health care professional and seek emergency treatment if you develop breathing problems; changes in vision; chest pain; severe, sudden headache; pain, swelling, warmth in the leg; trouble speaking; sudden numbness or weakness of the face, arm, or leg. These can be signs that your condition has gotten worse. If you are going to have surgery, tell your doctor or health care professional that you are taking this medicine. Tell your health care professional that you use this medicine before you have a spinal or epidural procedure. Sometimes people who take this medicine have bleeding problems around the spine when they have a spinal or epidural procedure. This bleeding is very rare. If you have a spinal or epidural procedure while on this medicine, call your  health care professional immediately if you have back pain, numbness or tingling (especially in your legs and feet), muscle weakness, paralysis, or loss of bladder or bowel control. Avoid sports and activities that might cause injury while you are using this medicine. Severe falls or injuries can cause unseen bleeding. Be careful when using sharp tools or knives. Consider using an Neurosurgeon. Take special care brushing or flossing your teeth. Report any injuries, bruising, or red spots on the skin to your doctor or health care professional. What side effects may I notice from receiving this medicine? Side effects that you should report to your doctor or health care professional as soon as possible: -allergic reactions like skin rash, itching or hives, swelling of the face, lips, or tongue -signs and symptoms of bleeding such as bloody or black, tarry stools; red or dark-brown urine; spitting up blood or brown material that looks like coffee grounds; red spots on the skin; unusual bruising or bleeding from the eye, gums, or nose This list may not describe all possible side effects. Call your doctor for medical advice about side effects. You may report side effects to FDA at 1-800-FDA-1088. Where should I keep my medicine? Keep out of the reach of children. Store at room temperature between 20 and 25 degrees C (68 and 77 degrees F). Throw away any unused medicine after the expiration date. NOTE: This sheet is a summary. It may not cover all possible information. If you have questions about this medicine, talk to your doctor, pharmacist, or health care provider.    2016, Elsevier/[employer]. (2013-01-20 11:59:24)

## 2015-10-01 NOTE — Assessment & Plan Note (Signed)
Tick bites doing well. Patient is very worried so we'll offer prophylactic doxycycline therapy. Warned patient about the risk of pregnancy with taking this medication and the risk of sunburn.

## 2015-10-01 NOTE — Assessment & Plan Note (Signed)
Leg mass is likely hematoma versus developing abscess based on ultrasound. DVT is ruled out. Plan for watchful waiting with warm compresses. Doxycycline for tick borne illness may be useful for the hopeful prevention of abscess formation. Return as needed.

## 2016-01-10 ENCOUNTER — Ambulatory Visit (INDEPENDENT_AMBULATORY_CARE_PROVIDER_SITE_OTHER): Payer: PRIVATE HEALTH INSURANCE | Admitting: Family

## 2016-01-10 ENCOUNTER — Encounter: Payer: Self-pay | Admitting: Family

## 2016-01-10 VITALS — BP 125/87 | HR 78 | Ht 67.0 in | Wt 161.0 lb

## 2016-01-10 DIAGNOSIS — B009 Herpesviral infection, unspecified: Secondary | ICD-10-CM | POA: Diagnosis not present

## 2016-01-10 DIAGNOSIS — Z124 Encounter for screening for malignant neoplasm of cervix: Secondary | ICD-10-CM | POA: Diagnosis not present

## 2016-01-10 DIAGNOSIS — Z01419 Encounter for gynecological examination (general) (routine) without abnormal findings: Secondary | ICD-10-CM | POA: Diagnosis not present

## 2016-01-10 MED ORDER — ACYCLOVIR 200 MG PO CAPS: 200.0000 mg | ORAL_CAPSULE | Freq: Every day | ORAL | 3 refills | Status: DC

## 2016-01-10 NOTE — Patient Instructions (Signed)
Preventive Care for Adults, Female A healthy lifestyle and preventive care can promote health and wellness. Preventive health guidelines for women include the following key practices.  A routine yearly physical is a good way to check with your health care provider about your health and preventive screening. It is a chance to share any concerns and updates on your health and to receive a thorough exam.  Visit your dentist for a routine exam and preventive care every 6 months. Brush your teeth twice a day and floss once a day. Good oral hygiene prevents tooth decay and gum disease.  The frequency of eye exams is based on your age, health, family medical history, use of contact lenses, and other factors. Follow your health care provider's recommendations for frequency of eye exams.  Eat a healthy diet. Foods like vegetables, fruits, whole grains, low-fat dairy products, and lean protein foods contain the nutrients you need without too many calories. Decrease your intake of foods high in solid fats, added sugars, and salt. Eat the right amount of calories for you.Get information about a proper diet from your health care provider, if necessary.  Regular physical exercise is one of the most important things you can do for your health. Most adults should get at least 150 minutes of moderate-intensity exercise (any activity that increases your heart rate and causes you to sweat) each week. In addition, most adults need muscle-strengthening exercises on 2 or more days a week.  Maintain a healthy weight. The body mass index (BMI) is a screening tool to identify possible weight problems. It provides an estimate of body fat based on height and weight. Your health care provider can find your BMI and can help you achieve or maintain a healthy weight.For adults 20 years and older:  A BMI below 18.5 is considered underweight.  A BMI of 18.5 to 24.9 is normal.  A BMI of 25 to 29.9 is considered  overweight.  A BMI of 30 and above is considered obese.  Maintain normal blood lipids and cholesterol levels by exercising and minimizing your intake of saturated fat. Eat a balanced diet with plenty of fruit and vegetables. Blood tests for lipids and cholesterol should begin at age 64 and be repeated every 5 years. If your lipid or cholesterol levels are high, you are over 50, or you are at high risk for heart disease, you may need your cholesterol levels checked more frequently.Ongoing high lipid and cholesterol levels should be treated with medicines if diet and exercise are not working.  If you smoke, find out from your health care provider how to quit. If you do not use tobacco, do not start.  Lung cancer screening is recommended for adults aged 52-80 years who are at high risk for developing lung cancer because of a history of smoking. A yearly low-dose CT scan of the lungs is recommended for people who have at least a 30-pack-year history of smoking and are a current smoker or have quit within the past 15 years. A pack year of smoking is smoking an average of 1 pack of cigarettes a day for 1 year (for example: 1 pack a day for 30 years or 2 packs a day for 15 years). Yearly screening should continue until the smoker has stopped smoking for at least 15 years. Yearly screening should be stopped for people who develop a health problem that would prevent them from having lung cancer treatment.  If you are pregnant, do not drink alcohol. If you are  breastfeeding, be very cautious about drinking alcohol. If you are not pregnant and choose to drink alcohol, do not have more than 1 drink per day. One drink is considered to be 12 ounces (355 mL) of beer, 5 ounces (148 mL) of wine, or 1.5 ounces (44 mL) of liquor.  Avoid use of street drugs. Do not share needles with anyone. Ask for help if you need support or instructions about stopping the use of drugs.  High blood pressure causes heart disease and  increases the risk of stroke. Your blood pressure should be checked at least every 1 to 2 years. Ongoing high blood pressure should be treated with medicines if weight loss and exercise do not work.  If you are 25-78 years old, ask your health care provider if you should take aspirin to prevent strokes.  Diabetes screening is done by taking a blood sample to check your blood glucose level after you have not eaten for a certain period of time (fasting). If you are not overweight and you do not have risk factors for diabetes, you should be screened once every 3 years starting at age 86. If you are overweight or obese and you are 3-87 years of age, you should be screened for diabetes every year as part of your cardiovascular risk assessment.  Breast cancer screening is essential preventive care for women. You should practice "breast self-awareness." This means understanding the normal appearance and feel of your breasts and may include breast self-examination. Any changes detected, no matter how small, should be reported to a health care provider. Women in their 66s and 30s should have a clinical breast exam (CBE) by a health care provider as part of a regular health exam every 1 to 3 years. After age 43, women should have a CBE every year. Starting at age 37, women should consider having a mammogram (breast X-ray test) every year. Women who have a family history of breast cancer should talk to their health care provider about genetic screening. Women at a high risk of breast cancer should talk to their health care providers about having an MRI and a mammogram every year.  Breast cancer gene (BRCA)-related cancer risk assessment is recommended for women who have family members with BRCA-related cancers. BRCA-related cancers include breast, ovarian, tubal, and peritoneal cancers. Having family members with these cancers may be associated with an increased risk for harmful changes (mutations) in the breast  cancer genes BRCA1 and BRCA2. Results of the assessment will determine the need for genetic counseling and BRCA1 and BRCA2 testing.  Your health care provider may recommend that you be screened regularly for cancer of the pelvic organs (ovaries, uterus, and vagina). This screening involves a pelvic examination, including checking for microscopic changes to the surface of your cervix (Pap test). You may be encouraged to have this screening done every 3 years, beginning at age 78.  For women ages 79-65, health care providers may recommend pelvic exams and Pap testing every 3 years, or they may recommend the Pap and pelvic exam, combined with testing for human papilloma virus (HPV), every 5 years. Some types of HPV increase your risk of cervical cancer. Testing for HPV may also be done on women of any age with unclear Pap test results.  Other health care providers may not recommend any screening for nonpregnant women who are considered low risk for pelvic cancer and who do not have symptoms. Ask your health care provider if a screening pelvic exam is right for  you.  If you have had past treatment for cervical cancer or a condition that could lead to cancer, you need Pap tests and screening for cancer for at least 20 years after your treatment. If Pap tests have been discontinued, your risk factors (such as having a new sexual partner) need to be reassessed to determine if screening should resume. Some women have medical problems that increase the chance of getting cervical cancer. In these cases, your health care provider may recommend more frequent screening and Pap tests.  Colorectal cancer can be detected and often prevented. Most routine colorectal cancer screening begins at the age of 50 years and continues through age 75 years. However, your health care provider may recommend screening at an earlier age if you have risk factors for colon cancer. On a yearly basis, your health care provider may provide  home test kits to check for hidden blood in the stool. Use of a small camera at the end of a tube, to directly examine the colon (sigmoidoscopy or colonoscopy), can detect the earliest forms of colorectal cancer. Talk to your health care provider about this at age 50, when routine screening begins. Direct exam of the colon should be repeated every 5-10 years through age 75 years, unless early forms of precancerous polyps or small growths are found.  People who are at an increased risk for hepatitis B should be screened for this virus. You are considered at high risk for hepatitis B if:  You were born in a country where hepatitis B occurs often. Talk with your health care provider about which countries are considered high risk.  Your parents were born in a high-risk country and you have not received a shot to protect against hepatitis B (hepatitis B vaccine).  You have HIV or AIDS.  You use needles to inject street drugs.  You live with, or have sex with, someone who has hepatitis B.  You get hemodialysis treatment.  You take certain medicines for conditions like cancer, organ transplantation, and autoimmune conditions.  Hepatitis C blood testing is recommended for all people born from 1945 through 1965 and any individual with known risks for hepatitis C.  Practice safe sex. Use condoms and avoid high-risk sexual practices to reduce the spread of sexually transmitted infections (STIs). STIs include gonorrhea, chlamydia, syphilis, trichomonas, herpes, HPV, and human immunodeficiency virus (HIV). Herpes, HIV, and HPV are viral illnesses that have no cure. They can result in disability, cancer, and death.  You should be screened for sexually transmitted illnesses (STIs) including gonorrhea and chlamydia if:  You are sexually active and are younger than 24 years.  You are older than 24 years and your health care provider tells you that you are at risk for this type of infection.  Your sexual  activity has changed since you were last screened and you are at an increased risk for chlamydia or gonorrhea. Ask your health care provider if you are at risk.  If you are at risk of being infected with HIV, it is recommended that you take a prescription medicine daily to prevent HIV infection. This is called preexposure prophylaxis (PrEP). You are considered at risk if:  You are sexually active and do not regularly use condoms or know the HIV status of your partner(s).  You take drugs by injection.  You are sexually active with a partner who has HIV.  Talk with your health care provider about whether you are at high risk of being infected with HIV. If   you choose to begin PrEP, you should first be tested for HIV. You should then be tested every 3 months for as long as you are taking PrEP.  Osteoporosis is a disease in which the bones lose minerals and strength with aging. This can result in serious bone fractures or breaks. The risk of osteoporosis can be identified using a bone density scan. Women ages 1 years and over and women at risk for fractures or osteoporosis should discuss screening with their health care providers. Ask your health care provider whether you should take a calcium supplement or vitamin D to reduce the rate of osteoporosis.  Menopause can be associated with physical symptoms and risks. Hormone replacement therapy is available to decrease symptoms and risks. You should talk to your health care provider about whether hormone replacement therapy is right for you.  Use sunscreen. Apply sunscreen liberally and repeatedly throughout the day. You should seek shade when your shadow is shorter than you. Protect yourself by wearing long sleeves, pants, a wide-brimmed hat, and sunglasses year round, whenever you are outdoors.  Once a month, do a whole body skin exam, using a mirror to look at the skin on your back. Tell your health care provider of new moles, moles that have irregular  borders, moles that are larger than a pencil eraser, or moles that have changed in shape or color.  Stay current with required vaccines (immunizations).  Influenza vaccine. All adults should be immunized every year.  Tetanus, diphtheria, and acellular pertussis (Td, Tdap) vaccine. Pregnant women should receive 1 dose of Tdap vaccine during each pregnancy. The dose should be obtained regardless of the length of time since the last dose. Immunization is preferred during the 27th-36th week of gestation. An adult who has not previously received Tdap or who does not know her vaccine status should receive 1 dose of Tdap. This initial dose should be followed by tetanus and diphtheria toxoids (Td) booster doses every 10 years. Adults with an unknown or incomplete history of completing a 3-dose immunization series with Td-containing vaccines should begin or complete a primary immunization series including a Tdap dose. Adults should receive a Td booster every 10 years.  Varicella vaccine. An adult without evidence of immunity to varicella should receive 2 doses or a second dose if she has previously received 1 dose. Pregnant females who do not have evidence of immunity should receive the first dose after pregnancy. This first dose should be obtained before leaving the health care facility. The second dose should be obtained 4-8 weeks after the first dose.  Human papillomavirus (HPV) vaccine. Females aged 13-26 years who have not received the vaccine previously should obtain the 3-dose series. The vaccine is not recommended for use in pregnant females. However, pregnancy testing is not needed before receiving a dose. If a female is found to be pregnant after receiving a dose, no treatment is needed. In that case, the remaining doses should be delayed until after the pregnancy. Immunization is recommended for any person with an immunocompromised condition through the age of 24 years if she did not get any or all doses  earlier. During the 3-dose series, the second dose should be obtained 4-8 weeks after the first dose. The third dose should be obtained 24 weeks after the first dose and 16 weeks after the second dose.  Zoster vaccine. One dose is recommended for adults aged 97 years or older unless certain conditions are present.  Measles, mumps, and rubella (MMR) vaccine. Adults born  before 1957 generally are considered immune to measles and mumps. Adults born in [birth year] or later should have 1 or more doses of MMR vaccine unless there is a contraindication to the vaccine or there is laboratory evidence of immunity to each of the three diseases. A routine second dose of MMR vaccine should be obtained at least 28 days after the first dose for students attending postsecondary schools, health care workers, or international travelers. People who received inactivated measles vaccine or an unknown type of measles vaccine during 1963-1967 should receive 2 doses of MMR vaccine. People who received inactivated mumps vaccine or an unknown type of mumps vaccine before 1979 and are at high risk for mumps infection should consider immunization with 2 doses of MMR vaccine. For females of childbearing age, rubella immunity should be determined. If there is no evidence of immunity, females who are not pregnant should be vaccinated. If there is no evidence of immunity, females who are pregnant should delay immunization until after pregnancy. Unvaccinated health care workers born before 60 who lack laboratory evidence of measles, mumps, or rubella immunity or laboratory confirmation of disease should consider measles and mumps immunization with 2 doses of MMR vaccine or rubella immunization with 1 dose of MMR vaccine.  Pneumococcal 13-valent conjugate (PCV13) vaccine. When indicated, a person who is uncertain of his immunization history and has no record of immunization should receive the PCV13 vaccine. All adults 61 years of age and older  should receive this vaccine. An adult aged 92 years or older who has certain medical conditions and has not been previously immunized should receive 1 dose of PCV13 vaccine. This PCV13 should be followed with a dose of pneumococcal polysaccharide (PPSV23) vaccine. Adults who are at high risk for pneumococcal disease should obtain the PPSV23 vaccine at least 8 weeks after the dose of PCV13 vaccine. Adults older than 35 years of age who have normal immune system function should obtain the PPSV23 vaccine dose at least 1 year after the dose of PCV13 vaccine.  Pneumococcal polysaccharide (PPSV23) vaccine. When PCV13 is also indicated, PCV13 should be obtained first. All adults aged 2 years and older should be immunized. An adult younger than age 30 years who has certain medical conditions should be immunized. Any person who resides in a nursing home or long-term care facility should be immunized. An adult smoker should be immunized. People with an immunocompromised condition and certain other conditions should receive both PCV13 and PPSV23 vaccines. People with human immunodeficiency virus (HIV) infection should be immunized as soon as possible after diagnosis. Immunization during chemotherapy or radiation therapy should be avoided. Routine use of PPSV23 vaccine is not recommended for American Indians, [place] Natives, or people younger than 65 years unless there are medical conditions that require PPSV23 vaccine. When indicated, people who have unknown immunization and have no record of immunization should receive PPSV23 vaccine. One-time revaccination 5 years after the first dose of PPSV23 is recommended for people aged 19-64 years who have chronic kidney failure, nephrotic syndrome, asplenia, or immunocompromised conditions. People who received 1-2 doses of PPSV23 before age 44 years should receive another dose of PPSV23 vaccine at age 83 years or later if at least 5 years have passed since the previous dose. Doses  of PPSV23 are not needed for people immunized with PPSV23 at or after age 20 years.  Meningococcal vaccine. Adults with asplenia or persistent complement component deficiencies should receive 2 doses of quadrivalent meningococcal conjugate (MenACWY-D) vaccine. The doses should be obtained  at least 2 months apart. Microbiologists working with certain meningococcal bacteria, [place] recruits, people at risk during an outbreak, and people who travel to or live in countries with a high rate of meningitis should be immunized. A first-year college student up through age 28 years who is living in a residence hall should receive a dose if she did not receive a dose on or after her 16th birthday. Adults who have certain high-risk conditions should receive one or more doses of vaccine.  Hepatitis A vaccine. Adults who wish to be protected from this disease, have certain high-risk conditions, work with hepatitis A-infected animals, work in hepatitis A research labs, or travel to or work in countries with a high rate of hepatitis A should be immunized. Adults who were previously unvaccinated and who anticipate close contact with an international adoptee during the first 60 days after arrival in the [place] from a country with a high rate of hepatitis A should be immunized.  Hepatitis B vaccine. Adults who wish to be protected from this disease, have certain high-risk conditions, may be exposed to blood or other infectious body fluids, are household contacts or sex partners of hepatitis B positive people, are clients or workers in certain care facilities, or travel to or work in countries with a high rate of hepatitis B should be immunized.  Haemophilus influenzae type b (Hib) vaccine. A previously unvaccinated person with asplenia or sickle cell disease or having a scheduled splenectomy should receive 1 dose of Hib vaccine. Regardless of previous immunization, a recipient of a hematopoietic stem cell transplant  should receive a 3-dose series 6-12 months after her successful transplant. Hib vaccine is not recommended for adults with HIV infection. Preventive Services / Frequency Ages 71 to 87 years  Blood pressure check.** / Every 3-5 years.  Lipid and cholesterol check.** / Every 5 years beginning at age 1.  Clinical breast exam.** / Every 3 years for women in their 3s and 31s.  BRCA-related cancer risk assessment.** / For women who have family members with a BRCA-related cancer (breast, ovarian, tubal, or peritoneal cancers).  Pap test.** / Every 2 years from ages 50 through 86. Every 3 years starting at age 87 through age 7 or 75 with a history of 3 consecutive normal Pap tests.  HPV screening.** / Every 3 years from ages 59 through ages 35 to 6 with a history of 3 consecutive normal Pap tests.  Hepatitis C blood test.** / For any individual with known risks for hepatitis C.  Skin self-exam. / Monthly.  Influenza vaccine. / Every year.  Tetanus, diphtheria, and acellular pertussis (Tdap, Td) vaccine.** / Consult your health care provider. Pregnant women should receive 1 dose of Tdap vaccine during each pregnancy. 1 dose of Td every 10 years.  Varicella vaccine.** / Consult your health care provider. Pregnant females who do not have evidence of immunity should receive the first dose after pregnancy.  HPV vaccine. / 3 doses over 6 months, if 72 and younger. The vaccine is not recommended for use in pregnant females. However, pregnancy testing is not needed before receiving a dose.  Measles, mumps, rubella (MMR) vaccine.** / You need at least 1 dose of MMR if you were born in 1957 or later. You may also need a 2nd dose. For females of childbearing age, rubella immunity should be determined. If there is no evidence of immunity, females who are not pregnant should be vaccinated. If there is no evidence of immunity, females who are  pregnant should delay immunization until after  pregnancy.  Pneumococcal 13-valent conjugate (PCV13) vaccine.** / Consult your health care provider.  Pneumococcal polysaccharide (PPSV23) vaccine.** / 1 to 2 doses if you smoke cigarettes or if you have certain conditions.  Meningococcal vaccine.** / 1 dose if you are age 87 to 44 years and a Market researcher living in a residence hall, or have one of several medical conditions, you need to get vaccinated against meningococcal disease. You may also need additional booster doses.  Hepatitis A vaccine.** / Consult your health care provider.  Hepatitis B vaccine.** / Consult your health care provider.  Haemophilus influenzae type b (Hib) vaccine.** / Consult your health care provider. Ages 86 to 38 years  Blood pressure check.** / Every year.  Lipid and cholesterol check.** / Every 5 years beginning at age 49 years.  Lung cancer screening. / Every year if you are aged 71-80 years and have a 30-pack-year history of smoking and currently smoke or have quit within the past 15 years. Yearly screening is stopped once you have quit smoking for at least 15 years or develop a health problem that would prevent you from having lung cancer treatment.  Clinical breast exam.** / Every year after age 51 years.  BRCA-related cancer risk assessment.** / For women who have family members with a BRCA-related cancer (breast, ovarian, tubal, or peritoneal cancers).  Mammogram.** / Every year beginning at age 18 years and continuing for as long as you are in good health. Consult with your health care provider.  Pap test.** / Every 3 years starting at age 63 years through age 37 or 57 years with a history of 3 consecutive normal Pap tests.  HPV screening.** / Every 3 years from ages 41 years through ages 76 to 23 years with a history of 3 consecutive normal Pap tests.  Fecal occult blood test (FOBT) of stool. / Every year beginning at age 36 years and continuing until age 51 years. You may not need  to do this test if you get a colonoscopy every 10 years.  Flexible sigmoidoscopy or colonoscopy.** / Every 5 years for a flexible sigmoidoscopy or every 10 years for a colonoscopy beginning at age 36 years and continuing until age 35 years.  Hepatitis C blood test.** / For all people born from [birth year] through 1965 and any individual with known risks for hepatitis C.  Skin self-exam. / Monthly.  Influenza vaccine. / Every year.  Tetanus, diphtheria, and acellular pertussis (Tdap/Td) vaccine.** / Consult your health care provider. Pregnant women should receive 1 dose of Tdap vaccine during each pregnancy. 1 dose of Td every 10 years.  Varicella vaccine.** / Consult your health care provider. Pregnant females who do not have evidence of immunity should receive the first dose after pregnancy.  Zoster vaccine.** / 1 dose for adults aged 73 years or older.  Measles, mumps, rubella (MMR) vaccine.** / You need at least 1 dose of MMR if you were born in 1957 or later. You may also need a second dose. For females of childbearing age, rubella immunity should be determined. If there is no evidence of immunity, females who are not pregnant should be vaccinated. If there is no evidence of immunity, females who are pregnant should delay immunization until after pregnancy.  Pneumococcal 13-valent conjugate (PCV13) vaccine.** / Consult your health care provider.  Pneumococcal polysaccharide (PPSV23) vaccine.** / 1 to 2 doses if you smoke cigarettes or if you have certain conditions.  Meningococcal vaccine.** /  Consult your health care provider.  Hepatitis A vaccine.** / Consult your health care provider.  Hepatitis B vaccine.** / Consult your health care provider.  Haemophilus influenzae type b (Hib) vaccine.** / Consult your health care provider. Ages 80 years and over  Blood pressure check.** / Every year.  Lipid and cholesterol check.** / Every 5 years beginning at age 62 years.  Lung cancer  screening. / Every year if you are aged 32-80 years and have a 30-pack-year history of smoking and currently smoke or have quit within the past 15 years. Yearly screening is stopped once you have quit smoking for at least 15 years or develop a health problem that would prevent you from having lung cancer treatment.  Clinical breast exam.** / Every year after age 61 years.  BRCA-related cancer risk assessment.** / For women who have family members with a BRCA-related cancer (breast, ovarian, tubal, or peritoneal cancers).  Mammogram.** / Every year beginning at age 39 years and continuing for as long as you are in good health. Consult with your health care provider.  Pap test.** / Every 3 years starting at age 85 years through age 74 or 72 years with 3 consecutive normal Pap tests. Testing can be stopped between 65 and 70 years with 3 consecutive normal Pap tests and no abnormal Pap or HPV tests in the past 10 years.  HPV screening.** / Every 3 years from ages 55 years through ages 67 or 77 years with a history of 3 consecutive normal Pap tests. Testing can be stopped between 65 and 70 years with 3 consecutive normal Pap tests and no abnormal Pap or HPV tests in the past 10 years.  Fecal occult blood test (FOBT) of stool. / Every year beginning at age 81 years and continuing until age 22 years. You may not need to do this test if you get a colonoscopy every 10 years.  Flexible sigmoidoscopy or colonoscopy.** / Every 5 years for a flexible sigmoidoscopy or every 10 years for a colonoscopy beginning at age 67 years and continuing until age 22 years.  Hepatitis C blood test.** / For all people born from [birth year] through 1965 and any individual with known risks for hepatitis C.  Osteoporosis screening.** / A one-time screening for women ages 8 years and over and women at risk for fractures or osteoporosis.  Skin self-exam. / Monthly.  Influenza vaccine. / Every year.  Tetanus, diphtheria, and  acellular pertussis (Tdap/Td) vaccine.** / 1 dose of Td every 10 years.  Varicella vaccine.** / Consult your health care provider.  Zoster vaccine.** / 1 dose for adults aged 56 years or older.  Pneumococcal 13-valent conjugate (PCV13) vaccine.** / Consult your health care provider.  Pneumococcal polysaccharide (PPSV23) vaccine.** / 1 dose for all adults aged 15 years and older.  Meningococcal vaccine.** / Consult your health care provider.  Hepatitis A vaccine.** / Consult your health care provider.  Hepatitis B vaccine.** / Consult your health care provider.  Haemophilus influenzae type b (Hib) vaccine.** / Consult your health care provider. ** Family history and personal history of risk and conditions may change your health care provider's recommendations.   This information is not intended to replace advice given to you by your health care provider. Make sure you discuss any questions you have with your health care provider.   Document Released: 07/14/2001 Document Revised: 06/08/2014 Document Reviewed: 10/13/2010 Elsevier Interactive Patient Education [employer].

## 2016-01-10 NOTE — Addendum Note (Signed)
Addended by: Marlis EdelsonKARIM, WALIDAH N on: 01/10/2016 10:07 AM   Modules accepted: Orders

## 2016-01-10 NOTE — Addendum Note (Signed)
Addended by: Anell BarrHOWARD, Rhemi Balbach L on: 01/10/2016 10:10 AM   Modules accepted: Orders

## 2016-01-10 NOTE — Progress Notes (Signed)
   Subjective:     Janice Aguilar is a 35 y.o. female here for a routine exam.  Current complaints: none, doing well.  Children doing well, [facility] [employer].  Personal health questionnaire reviewed: yes.   Gynecologic History Patient's last menstrual period was 12/23/2015 (exact date). Contraception: vasectomy Last Pap: 2016. Results were: normal Last mammogram: n/a.   Obstetric History OB History  Gravida Para Term Preterm AB Living  3 3 3     3   SAB TAB Ectopic Multiple Live Births          3    # Outcome Date GA Lbr Len/2nd Weight Sex Delivery Anes PTL Lv  3 Term 08/22/11 3104w3d  6 lb 11.1 oz (3.035 kg) M Vag-Spont None  LIV  2 Term 10/04/08 3891w1d  8 lb 6 oz (3.799 kg) M Vag-Spont None N LIV  1 Term 03/03/00 1750w0d  7 lb 10.5 oz (3.473 kg) F Vag-Spont EPI N LIV       The following portions of the patient's history were reviewed and updated as appropriate: allergies, current medications, past family history, past medical history, past social history, past surgical history and problem list.  Review of Systems Pertinent items are noted in HPI.    Objective:   BP 125/87   Pulse 78   Ht 5\' 7"  (1.702 m)   Wt 161 lb (73 kg)   LMP 12/23/2015 (Exact Date)   BMI 25.22 kg/m  General appearance: alert, cooperative and appears stated age Head: Normocephalic, without obvious abnormality, atraumatic Neck: no adenopathy, no carotid bruit, no JVD, supple, symmetrical, trachea midline and thyroid not enlarged, symmetric, no tenderness/mass/nodules Lungs: clear to auscultation bilaterally Breasts: normal appearance, no masses or tenderness, No nipple retraction or dimpling, No nipple discharge or bleeding, No axillary or supraclavicular adenopathy, Normal to palpation without dominant masses, Taught monthly breast self examination Heart: regular rate and rhythm, S1, S2 normal, no murmur, click, rub or gallop Abdomen: soft, non-tender; bowel sounds normal; no masses,  no  organomegaly Pelvic: cervix normal in appearance, external genitalia normal, no adnexal masses or tenderness, no cervical motion tenderness, rectovaginal septum normal, uterus normal size, shape, and consistency and vagina normal without discharge Skin: Skin color, texture, turgor normal. No rashes or lesions     Assessment:    Healthy female exam.    Plan:    Contraception: vasectomy.  Refill acyclovir Pap smear collected  Lonny Eisen Kennith GainN Karim, CNM

## 2016-01-14 LAB — CYTOLOGY - PAP

## 2016-12-13 ENCOUNTER — Other Ambulatory Visit: Payer: Self-pay | Admitting: Family

## 2016-12-13 DIAGNOSIS — B009 Herpesviral infection, unspecified: Secondary | ICD-10-CM

## 2017-02-03 ENCOUNTER — Ambulatory Visit (INDEPENDENT_AMBULATORY_CARE_PROVIDER_SITE_OTHER): Payer: PRIVATE HEALTH INSURANCE | Admitting: Obstetrics & Gynecology

## 2017-02-03 ENCOUNTER — Encounter: Payer: Self-pay | Admitting: Obstetrics & Gynecology

## 2017-02-03 VITALS — BP 135/88 | HR 80 | Ht 67.0 in | Wt 146.0 lb

## 2017-02-03 DIAGNOSIS — Z01419 Encounter for gynecological examination (general) (routine) without abnormal findings: Secondary | ICD-10-CM | POA: Diagnosis not present

## 2017-02-03 MED ORDER — ACYCLOVIR 200 MG PO CAPS: 200.0000 mg | ORAL_CAPSULE | Freq: Every day | ORAL | 6 refills | Status: DC

## 2017-02-03 NOTE — Progress Notes (Signed)
Subjective:     Janice Aguilar is a 36 y.o. female here for a routine exam.  Current complaints: irritability and sadness for the week before menses.    Gynecologic History Patient's last menstrual period was 01/03/2017. Contraception: vasectomy Last Pap: 2013-2017 all nml with neg cotesting.   Obstetric History OB History  Gravida Para Term Preterm AB Living  3 3 3     3   SAB TAB Ectopic Multiple Live Births          3    # Outcome Date GA Lbr Len/2nd Weight Sex Delivery Anes PTL Lv  3 Term 08/22/11 7631w3d  6 lb 11.1 oz (3.035 kg) M Vag-Spont None  LIV  2 Term 10/04/08 6777w1d  8 lb 6 oz (3.799 kg) M Vag-Spont None N LIV  1 Term 03/03/00 4545w0d  7 lb 10.5 oz (3.473 kg) F Vag-Spont EPI N LIV       The following portions of the patient's history were reviewed and updated as appropriate: allergies, current medications, past family history, past medical history, past social history, past surgical history and problem list.  Review of Systems Pertinent items noted in HPI and remainder of comprehensive ROS otherwise negative.    Objective:      Vitals:   02/03/17 0911  BP: 135/88  Pulse: 80  Weight: 146 lb (66.2 kg)  Height: 5\' 7"  (1.702 m)   Vitals:  WNL General appearance: alert, cooperative and no distress  HEENT: Normocephalic, without obvious abnormality, atraumatic Eyes: negative Throat: lips, mucosa, and tongue normal; teeth and gums normal  Respiratory: Clear to auscultation bilaterally  CV: Regular rate and rhythm  Breasts:  Normal appearance, no masses or tenderness, no nipple retraction or dimpling  GI: Soft, non-tender; bowel sounds normal; no masses,  no organomegaly  GU: External Genitalia:  Tanner V, no lesion Urethra:  No prolapse   Vagina: Pink, normal rugae, no blood or discharge  Cervix: No CMT, no lesion  Uterus:  Normal size and contour, non tender  Adnexa: Normal, no masses, non tender  Musculoskeletal: No edema, redness or tenderness in the  calves or thighs  Skin: No lesions or rash; sun damage  Lymphatic: Axillary adenopathy: none     Psychiatric: Normal mood and behavior        Assessment:    Healthy female exam.   Possible PMDD    Plan:   1.  Pap with co-testing no earlier than 2020 (lots of counseling around testing interval 2.  Pt will think about Serifem for 2 weeks prior to menses for PMDD (pt deinies depression, HI.SI) 3.  Mammogram at 40 4. Derm for skin screening. 5.  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

## 2017-09-20 ENCOUNTER — Ambulatory Visit (INDEPENDENT_AMBULATORY_CARE_PROVIDER_SITE_OTHER): Payer: PRIVATE HEALTH INSURANCE | Admitting: Advanced Practice Midwife

## 2017-09-20 ENCOUNTER — Encounter: Payer: Self-pay | Admitting: Advanced Practice Midwife

## 2017-09-20 VITALS — BP 132/78 | HR 83 | Resp 16 | Ht 67.0 in

## 2017-09-20 DIAGNOSIS — N6320 Unspecified lump in the left breast, unspecified quadrant: Secondary | ICD-10-CM

## 2017-09-20 NOTE — Patient Instructions (Signed)
Breast Cyst A breast cyst is a sac in the breast that is filled with fluid. Breast cysts are usually noncancerous (benign). They are common among women, and they are most often located in the upper, outer portion of the breast. One or more cysts may develop. They form when fluid builds up inside of the breast glands. There are several types of breast cysts:  Macrocyst. This is a cyst that is about 2 inches (5.1 cm) across (in diameter).  Microcyst. This is a very small cyst that you cannot feel, but it can be seen with imaging tests such as an X-ray of the breast (mammogram) or ultrasound.  Galactocele. This is a cyst that contains milk. It may develop if you suddenly stop breastfeeding.  Breast cysts do not increase your risk of breast cancer. They usually disappear after menopause, unless you take artificial hormones (are on hormone therapy). What are the causes? The exact cause of breast cysts is not known. Possible causes include:  Blockage of tubes (ducts) in the breast glands, which leads to fluid buildup. Duct blockage may result from: ? Fibrocystic breast changes. This is a common, benign condition that occurs when women go through hormonal changes during the menstrual cycle. This is a common cause of multiple breast cysts. ? Overgrowth of breast tissue or breast glands. ? Scar tissue in the breast from previous surgery.  Changes in certain female hormones (estrogen and progesterone).  What increases the risk? You may be more likely to develop breast cysts if you have not gone through menopause. What are the signs or symptoms? Symptoms of a breast cyst may include:  Feeling one or more smooth, round, soft lumps (like grapes) in the breast that are easily moveable. The lump(s) may get bigger and more painful before your period and get smaller after your period.  Breast discomfort or pain.  How is this diagnosed? A cyst can be felt during a physical exam by your health care  provider. A mammogram and ultrasound will be done to confirm the diagnosis. Fluid may be removed from the cyst with a needle (fine-needle aspiration) and tested to make sure the cyst is not cancerous. How is this treated? Treatment may not be necessary. Your health care provider may monitor the cyst to see if it goes away on its own. If the cyst is uncomfortable or gets bigger, or if you do not like how the cyst makes your breast look, you may need treatment. Treatment may include:  Hormone treatment.  Fine-needle aspiration, to drain fluid from the cyst. There is a chance of the cyst coming back (recurring) after aspiration.  Surgery to remove the cyst.  Follow these instructions at home:  See your health care provider regularly. ? Get a yearly physical exam. ? If you are 20-40 years old, get a clinical breast exam every 1-3 years. After age 40, get this exam every year. ? Get mammograms as often as directed.  Do a breast self-exam every month, or as often as directed. Having many breast cysts, or "lumpy" breasts, may make it harder to feel for new lumps. Understand how your breasts normally look and feel, and write down any changes in your breasts so you can tell your health care provider about the changes. A breast self-exam involves: ? Comparing your breasts in the mirror. ? Looking for visible changes in your skin or nipples. ? Feeling for lumps or changes.  Take over-the-counter and prescription medicines only as told by your health care   provider.  Wear a supportive bra, especially when exercising.  Follow instructions from your health care provider about eating and drinking restrictions. ? Avoid caffeine. ? Cut down on salt (sodium) in what you eat and drink, especially before your menstrual period. Too much sodium can cause fluid buildup (retention), breast swelling, and discomfort.  Keep all follow-up visits as told your health care provider. This is important. Contact a  health care provider if:  You feel, or think you feel, a lump in your breast.  You notice that both breasts look or feel different than usual.  Your breast is still causing pain after your menstrual period is over.  You find new lumps or bumps that were not there before.  You feel lumps in your armpit (axilla). Get help right away if:  You have severe pain, tenderness, redness, or warmth in your breast.  You have fluid or blood leaking from your nipple.  Your breast lump becomes hard and painful.  You notice dimpling or wrinkling of the breast or nipple. This information is not intended to replace advice given to you by your health care provider. Make sure you discuss any questions you have with your health care provider. Document Released: 05/18/2005 Document Revised: 02/07/2016 Document Reviewed: 02/07/2016 Elsevier Interactive Patient Education  2017 Elsevier Inc.  

## 2017-09-20 NOTE — Progress Notes (Signed)
   Subjective:    Patient ID: Janice Aguilar, female    DOB: [birth year], 37 y.o.   MRN: [id number]   This is a 37 y.o. female who presents with c/o left breast lump just under nipple.  Has saline implants so difficult to palpate.  Slightly tender but may be due to her palpating it.  No other symptoms.    Other  This is a new problem. The current episode started 1 to 4 weeks ago. The problem occurs constantly. The problem has been unchanged. Pertinent negatives include no abdominal pain, chest pain, chills or fever. Associated symptoms comments: Lump left breast just under nipple . Nothing aggravates the symptoms. She has tried nothing for the symptoms.      Review of Systems  Constitutional: Negative for chills and fever.  Respiratory: Negative for shortness of breath.   Cardiovascular: Negative for chest pain.  Gastrointestinal: Negative for abdominal pain.  Skin:       Small lump just below left nipple        Objective:   Physical Exam  Constitutional: She is oriented to person, place, and time. She appears well-developed and well-nourished. No distress.  HENT:  Head: Normocephalic.  Cardiovascular: Normal rate.  Pulmonary/Chest: Effort normal. No respiratory distress. She exhibits mass. She exhibits no tenderness, no bony tenderness, no laceration, no edema, no deformity and no retraction. Right breast exhibits no inverted nipple, no mass, no nipple discharge, no skin change and no tenderness. Left breast exhibits mass (bilateral saline implants palpable) and tenderness (mobile, rubbery, 1cm). Left breast exhibits no inverted nipple, no nipple discharge and no skin change. Breasts are symmetrical.    Abdominal: Soft. There is no tenderness.  Neurological: She is alert and oriented to person, place, and time.  Skin: Skin is warm and dry.  Psychiatric: She has a normal mood and affect.          Assessment & Plan:  Left breast mass Fibrocystic changes Bilateral saline  implants  Referred to [facility] due to lack of insurance  Ordered left breast US Followup as indicated

## 2017-09-24 ENCOUNTER — Other Ambulatory Visit (HOSPITAL_COMMUNITY): Payer: Self-pay | Admitting: *Deleted

## 2017-09-24 DIAGNOSIS — N6320 Unspecified lump in the left breast, unspecified quadrant: Secondary | ICD-10-CM

## 2017-10-21 ENCOUNTER — Other Ambulatory Visit (HOSPITAL_COMMUNITY): Payer: Self-pay | Admitting: Obstetrics and Gynecology

## 2017-10-21 ENCOUNTER — Ambulatory Visit
Admission: RE | Admit: 2017-10-21 | Discharge: 2017-10-21 | Disposition: A | Discharge status: Home / Self Care | Payer: PRIVATE HEALTH INSURANCE | Source: Ambulatory Visit | Attending: Obstetrics and Gynecology | Admitting: Obstetrics and Gynecology

## 2017-10-21 ENCOUNTER — Ambulatory Visit (HOSPITAL_COMMUNITY)
Admission: RE | Admit: 2017-10-21 | Discharge: 2017-10-21 | Disposition: A | Discharge status: Home / Self Care | Payer: PRIVATE HEALTH INSURANCE | Source: Ambulatory Visit | Attending: Obstetrics and Gynecology | Admitting: Obstetrics and Gynecology

## 2017-10-21 ENCOUNTER — Encounter (HOSPITAL_COMMUNITY): Payer: Self-pay

## 2017-10-21 ENCOUNTER — Ambulatory Visit
Admission: RE | Admit: 2017-10-21 | Discharge: 2017-10-21 | Disposition: A | Discharge status: Home / Self Care | Payer: No Typology Code available for payment source | Source: Ambulatory Visit | Attending: Obstetrics and Gynecology | Admitting: Obstetrics and Gynecology

## 2017-10-21 VITALS — BP 119/88 | Ht 67.0 in | Wt 146.8 lb

## 2017-10-21 DIAGNOSIS — N6324 Unspecified lump in the left breast, lower inner quadrant: Secondary | ICD-10-CM

## 2017-10-21 DIAGNOSIS — N6325 Unspecified lump in the left breast, overlapping quadrants: Secondary | ICD-10-CM

## 2017-10-21 DIAGNOSIS — N6320 Unspecified lump in the left breast, unspecified quadrant: Secondary | ICD-10-CM

## 2017-10-21 DIAGNOSIS — Z1239 Encounter for other screening for malignant neoplasm of breast: Secondary | ICD-10-CM

## 2017-10-21 NOTE — Patient Instructions (Signed)
Explained breast self awareness with Laretta Bolster. Patient did not need a Pap smear today due to last Pap smear and HPV typing was 01/10/2016. Let her know [facility] will cover Pap smears and HPV typing every 5 years unless has a history of abnormal Pap smears. Referred patient to the [facility] for a diagnostic mammogram and possible left breast ultrasound. Appointment scheduled for Thursday, Oct 21, 2017 at 1320. Laretta Bolster verbalized understanding.  [place]ann[place], Kathaleen Maser, RN 1:08 PM

## 2017-10-21 NOTE — Progress Notes (Signed)
Complaints of two left breast lumps x 7-8 weeks that is painful at times. Patient states the pain comes and goes. Patient rates the pain at a 3-4 out of 10.  Pap Smear: Pap smear not completed today. Last Pap smear was 01/10/2016 at [facility] and normal with negative HPV. Per patient has a history of 4-5 abnormal Pap smears. Patient had an abnormal Pap smear 01/19/2011 that was LGSIL and a repeat Pap smear was completed that was normal. Patient had an abnormal Pap smear 05/16/2010 that was ASCUS with positive HPV and a colposcopy was completed for follow-up 06/11/2010 that was benign. Patient stated she had an abnormal Pap smear around 12 years ago that a colposcopy and LEEP was completed for follow-up. Patient stated she had two other colposcopies in the past to follow-up for an abnormal Pap smear with a total of 4 colposcopies. Per patient she has had at least three normal Pap smears since her last abnormal Pap smear. Last three Pap smear results and last colposcopy result is in Epic.  Physical exam: Breasts Breasts symmetrical. No skin abnormalities bilateral breasts. No nipple retraction bilateral breasts. No nipple discharge bilateral breasts. No lymphadenopathy. No lumps palpated right breast. Palpated two lumps within the left breast at 8 o'clock next to the areola and at 6 o'clock 7 cm from the nipple. Complaints of tenderness when palpated lumps. Referred patient to the [facility] for a diagnostic mammogram and possible left breast ultrasound. Appointment scheduled for Thursday, Oct 21, 2017 at 1320.        Pelvic/Bimanual No Pap smear completed today since last Pap smear and HPV typing was 8/11/217. Pap smear not indicated per [facility] guidelines.   Smoking History: Patient is a former smoker that quit 12 years ago.  Patient Navigation: Patient education provided. Access to services provided for patient through [facility] program. ;  Breast  and Cervical Cancer Risk Assessment: Patient has no family history of breast cancer, known genetic mutations, or radiation treatment to the chest before age 18. Per patient has a history of cervical dysplasia. Patient has no history of being immunocompromised or DES exposure in-utero. Patient has a 5-year risk of breast cancer at 0.2% and a lifetime risk at 6.8%.

## 2017-11-15 ENCOUNTER — Encounter (HOSPITAL_COMMUNITY): Payer: Self-pay | Admitting: *Deleted

## 2018-04-14 ENCOUNTER — Other Ambulatory Visit: Payer: Self-pay | Admitting: Obstetrics & Gynecology

## 2018-04-19 ENCOUNTER — Other Ambulatory Visit: Payer: Self-pay | Admitting: *Deleted

## 2018-04-19 MED ORDER — ACYCLOVIR 200 MG PO CAPS: 200.0000 mg | ORAL_CAPSULE | Freq: Every day | ORAL | 6 refills | Status: AC

## 2018-04-19 NOTE — Telephone Encounter (Signed)
Pt call requesting a RF on Valtrex.  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  RF given and pt knows she is due for her annual.

## 2019-04-24 ENCOUNTER — Ambulatory Visit: Payer: Self-pay | Admitting: Obstetrics & Gynecology

## 2019-04-24 ENCOUNTER — Telehealth: Payer: Self-pay | Admitting: *Deleted

## 2019-04-24 NOTE — Telephone Encounter (Signed)
Returned call from 11/20 and 11/23 to reschedule appointment on 04/24/19. Left patient a message to call and schedule.
